# Patient Record
Sex: Female | Born: 1985 | Race: White | Hispanic: No | Marital: Married | Smoking: Never smoker
Health system: Southern US, Community
[De-identification: ages and names within clinical notes are randomized; demographics above are authoritative.]

## PROBLEM LIST (undated history)

## (undated) ENCOUNTER — Inpatient Hospital Stay (HOSPITAL_COMMUNITY): Payer: Self-pay

## (undated) DIAGNOSIS — I471 Supraventricular tachycardia, unspecified: Secondary | ICD-10-CM

## (undated) DIAGNOSIS — F32A Depression, unspecified: Secondary | ICD-10-CM

## (undated) DIAGNOSIS — F329 Major depressive disorder, single episode, unspecified: Secondary | ICD-10-CM

## (undated) DIAGNOSIS — F419 Anxiety disorder, unspecified: Secondary | ICD-10-CM

## (undated) DIAGNOSIS — R569 Unspecified convulsions: Secondary | ICD-10-CM

## (undated) DIAGNOSIS — D649 Anemia, unspecified: Secondary | ICD-10-CM

## (undated) DIAGNOSIS — E038 Other specified hypothyroidism: Secondary | ICD-10-CM

## (undated) DIAGNOSIS — E039 Hypothyroidism, unspecified: Secondary | ICD-10-CM

## (undated) HISTORY — PX: APPENDECTOMY: SHX54

## (undated) HISTORY — DX: Hypothyroidism, unspecified: E03.9

## (undated) HISTORY — DX: Other specified hypothyroidism: E03.8

## (undated) HISTORY — DX: Anxiety disorder, unspecified: F41.9

## (undated) HISTORY — DX: Supraventricular tachycardia, unspecified: I47.10

## (undated) HISTORY — DX: Supraventricular tachycardia: I47.1

## (undated) HISTORY — DX: Anemia, unspecified: D64.9

## (undated) HISTORY — DX: Unspecified convulsions: R56.9

---
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
## 2004-05-18 ENCOUNTER — Ambulatory Visit: Payer: Self-pay | Admitting: Family Medicine

## 2004-06-26 ENCOUNTER — Ambulatory Visit: Payer: Self-pay | Admitting: Family Medicine

## 2004-10-17 ENCOUNTER — Ambulatory Visit: Payer: Self-pay | Admitting: Family Medicine

## 2005-03-16 ENCOUNTER — Ambulatory Visit: Payer: Self-pay | Admitting: Family Medicine

## 2006-12-17 ENCOUNTER — Ambulatory Visit: Payer: Self-pay | Admitting: Family Medicine

## 2007-04-22 ENCOUNTER — Ambulatory Visit: Payer: Self-pay | Admitting: Family Medicine

## 2007-04-22 DIAGNOSIS — E038 Other specified hypothyroidism: Secondary | ICD-10-CM | POA: Insufficient documentation

## 2007-04-22 DIAGNOSIS — E039 Hypothyroidism, unspecified: Secondary | ICD-10-CM

## 2007-04-23 LAB — CONVERTED CEMR LAB
BUN: 6 mg/dL (ref 6–23)
Basophils Absolute: 0 10*3/uL (ref 0.0–0.1)
Basophils Relative: 0.3 % (ref 0.0–1.0)
CO2: 25 meq/L (ref 19–32)
Calcium: 9.6 mg/dL (ref 8.4–10.5)
Chloride: 104 meq/L (ref 96–112)
Cholesterol: 140 mg/dL (ref 0–200)
Creatinine, Ser: 0.7 mg/dL (ref 0.4–1.2)
Eosinophils Absolute: 0 10*3/uL (ref 0.0–0.6)
Eosinophils Relative: 0.7 % (ref 0.0–5.0)
Free T4: 0.8 ng/dL (ref 0.6–1.6)
GFR calc Af Amer: 133 mL/min
GFR calc non Af Amer: 110 mL/min
Glucose, Bld: 91 mg/dL (ref 70–99)
HCT: 40.1 % (ref 36.0–46.0)
HDL: 78 mg/dL (ref >39.0)
Hemoglobin: 14.3 g/dL (ref 12.0–15.0)
LDL Cholesterol: 48 mg/dL (ref 0–99)
Lymphocytes Relative: 29.1 % (ref 12.0–46.0)
MCHC: 35.7 g/dL (ref 30.0–36.0)
MCV: 88.6 fL (ref 78.0–100.0)
Monocytes Absolute: 0.5 10*3/uL (ref 0.2–0.7)
Monocytes Relative: 7.3 % (ref 3.0–11.0)
Neutro Abs: 4.3 10*3/uL (ref 1.4–7.7)
Neutrophils Relative %: 62.6 % (ref 43.0–77.0)
Platelets: 148 10*3/uL — ABNORMAL LOW (ref 150–400)
Potassium: 4.4 meq/L (ref 3.5–5.1)
RBC: 4.52 M/uL (ref 3.87–5.11)
RDW: 11.8 % (ref 11.5–14.6)
Sodium: 137 meq/L (ref 135–145)
TSH: 1.44 microintl units/mL (ref 0.35–5.50)
Total CHOL/HDL Ratio: 1.8
Triglycerides: 72 mg/dL (ref 0–149)
VLDL: 14 mg/dL (ref 0–40)
WBC: 6.8 10*3/uL (ref 4.5–10.5)

## 2007-05-08 ENCOUNTER — Telehealth (INDEPENDENT_AMBULATORY_CARE_PROVIDER_SITE_OTHER): Payer: Self-pay | Admitting: *Deleted

## 2011-10-18 ENCOUNTER — Telehealth: Payer: Self-pay | Admitting: Internal Medicine

## 2011-10-18 NOTE — Telephone Encounter (Signed)
Mom called stated daughter is getting married Saturday and has sinus infection  Can you see her for this

## 2011-10-18 NOTE — Telephone Encounter (Signed)
Left message for patient's mother to return my call.

## 2011-10-19 NOTE — Telephone Encounter (Signed)
Left another message asking Patty to return my call, I am trying to find out what her symptoms are.

## 2013-10-27 LAB — OB RESULTS CONSOLE HEPATITIS B SURFACE ANTIGEN: Hepatitis B Surface Ag: NEGATIVE

## 2013-10-27 LAB — OB RESULTS CONSOLE RPR: RPR: NONREACTIVE

## 2013-10-27 LAB — OB RESULTS CONSOLE RUBELLA ANTIBODY, IGM: Rubella: IMMUNE

## 2013-10-27 LAB — OB RESULTS CONSOLE GC/CHLAMYDIA
Chlamydia: NEGATIVE
GC PROBE AMP, GENITAL: NEGATIVE

## 2013-10-27 LAB — OB RESULTS CONSOLE ABO/RH: RH Type: POSITIVE

## 2013-10-27 LAB — OB RESULTS CONSOLE HIV ANTIBODY (ROUTINE TESTING): HIV: NONREACTIVE

## 2013-10-27 LAB — OB RESULTS CONSOLE ANTIBODY SCREEN: ANTIBODY SCREEN: NEGATIVE

## 2014-05-06 LAB — OB RESULTS CONSOLE GBS: GBS: NEGATIVE

## 2014-05-21 NOTE — L&D Delivery Note (Signed)
Delivery Note At 3:01 AM a healthy female was delivered via Vaginal, Spontaneous Delivery (Presentation: ant occiput).  APGAR: 9, 9 ; weight 7 lb 13.6 oz (3560 g).   Placenta status: Intact, Spontaneous.  Cord: 3 vessels with the following complications: None.  Cord pH: N/A  Anesthesia: Epidural  Episiotomy: None Lacerations:  Deep 2nd degree Suture Repair: 2.0 3.0 vicryl Est. Blood Loss (mL): 350  Mom to postpartum.  Baby to Couplet care / Skin to Skin.  Eppie Barhorst,MARIE-LYNE 06/10/2014, 3:38 AM

## 2014-05-23 ENCOUNTER — Inpatient Hospital Stay (HOSPITAL_COMMUNITY)
Admission: AD | Admit: 2014-05-23 | Discharge: 2014-05-23 | Disposition: A | Discharge status: Home / Self Care | Payer: BC Managed Care – PPO | Source: Ambulatory Visit | Attending: Obstetrics & Gynecology | Admitting: Obstetrics & Gynecology

## 2014-05-23 ENCOUNTER — Encounter (HOSPITAL_COMMUNITY): Payer: Self-pay | Admitting: *Deleted

## 2014-05-23 DIAGNOSIS — Z3A38 38 weeks gestation of pregnancy: Secondary | ICD-10-CM | POA: Diagnosis not present

## 2014-05-23 DIAGNOSIS — O368130 Decreased fetal movements, third trimester, not applicable or unspecified: Secondary | ICD-10-CM | POA: Insufficient documentation

## 2014-05-23 HISTORY — DX: Major depressive disorder, single episode, unspecified: F32.9

## 2014-05-23 HISTORY — DX: Depression, unspecified: F32.A

## 2014-05-23 NOTE — Discharge Instructions (Signed)
Braxton Hicks Contractions °Contractions of the uterus can occur throughout pregnancy. Contractions are not always a sign that you are in labor.  °WHAT ARE BRAXTON HICKS CONTRACTIONS?  °Contractions that occur before labor are called Braxton Hicks contractions, or false labor. Toward the end of pregnancy (32-34 weeks), these contractions can develop more often and may become more forceful. This is not true labor because these contractions do not result in opening (dilatation) and thinning of the cervix. They are sometimes difficult to tell apart from true labor because these contractions can be forceful and people have different pain tolerances. You should not feel embarrassed if you go to the hospital with false labor. Sometimes, the only way to tell if you are in true labor is for your health care provider to look for changes in the cervix. °If there are no prenatal problems or other health problems associated with the pregnancy, it is completely safe to be sent home with false labor and await the onset of true labor. °HOW CAN YOU TELL THE DIFFERENCE BETWEEN TRUE AND FALSE LABOR? °False Labor °· The contractions of false labor are usually shorter and not as hard as those of true labor.   °· The contractions are usually irregular.   °· The contractions are often felt in the front of the lower abdomen and in the groin.   °· The contractions may go away when you walk around or change positions while lying down.   °· The contractions get weaker and are shorter lasting as time goes on.   °· The contractions do not usually become progressively stronger, regular, and closer together as with true labor.   °True Labor °· Contractions in true labor last 30-70 seconds, become very regular, usually become more intense, and increase in frequency.   °· The contractions do not go away with walking.   °· The discomfort is usually felt in the top of the uterus and spreads to the lower abdomen and low back.   °· True labor can be  determined by your health care provider with an exam. This will show that the cervix is dilating and getting thinner.   °WHAT TO REMEMBER °· Keep up with your usual exercises and follow other instructions given by your health care provider.   °· Take medicines as directed by your health care provider.   °· Keep your regular prenatal appointments.   °· Eat and drink lightly if you think you are going into labor.   °· If Braxton Hicks contractions are making you uncomfortable:   °¨ Change your position from lying down or resting to walking, or from walking to resting.   °¨ Sit and rest in a tub of warm water.   °¨ Drink 2-3 glasses of water. Dehydration may cause these contractions.   °¨ Do slow and deep breathing several times an hour.   °WHEN SHOULD I SEEK IMMEDIATE MEDICAL CARE? °Seek immediate medical care if: °· Your contractions become stronger, more regular, and closer together.   °· You have fluid leaking or gushing from your vagina.   °· You have a fever.   °· You pass blood-tinged mucus.   °· You have vaginal bleeding.   °· You have continuous abdominal pain.   °· You have low back pain that you never had before.   °· You feel your baby's head pushing down and causing pelvic pressure.   °· Your baby is not moving as much as it used to.   °Document Released: 05/07/2005 Document Revised: 05/12/2013 Document Reviewed: 02/16/2013 °ExitCare® Patient Information ©2015 [employer]. This information is not intended to replace advice given to you by your health care   provider. Make sure you discuss any questions you have with your health care provider. °Fetal Movement Counts °Patient Name: __________________________________________________ Patient Due Date: ____________________ °Performing a fetal movement count is highly recommended in high-risk pregnancies, but it is good for every pregnant woman to do. Your health care provider may ask you to start counting fetal movements at 28 weeks of the pregnancy. Fetal  movements often increase: °· After eating a full meal. °· After physical activity. °· After eating or drinking something sweet or cold. °· At rest. °Pay attention to when you feel the baby is most active. This will help you notice a pattern of your baby's sleep and wake cycles and what factors contribute to an increase in fetal movement. It is important to perform a fetal movement count at the same time each day when your baby is normally most active.  °HOW TO COUNT FETAL MOVEMENTS °· Find a quiet and comfortable area to sit or lie down on your left side. Lying on your left side provides the best blood and oxygen circulation to your baby. °· Write down the day and time on a sheet of paper or in a journal. °· Start counting kicks, flutters, swishes, rolls, or jabs in a 2-hour period. You should feel at least 10 movements within 2 hours. °· If you do not feel 10 movements in 2 hours, wait 2-3 hours and count again. Look for a change in the pattern or not enough counts in 2 hours. °SEEK MEDICAL CARE IF: °· You feel less than 10 counts in 2 hours, tried twice. °· There is no movement in over an hour. °· The pattern is changing or taking longer each day to reach 10 counts in 2 hours. °· You feel the baby is not moving as he or she usually does. °Date: ____________ Movements: ____________ Start time: ____________ Finish time: ____________  °Date: ____________ Movements: ____________ Start time: ____________ Finish time: ____________ °Date: ____________ Movements: ____________ Start time: ____________ Finish time: ____________ °Date: ____________ Movements: ____________ Start time: ____________ Finish time: ____________ °Date: ____________ Movements: ____________ Start time: ____________ Finish time: ____________ °Date: ____________ Movements: ____________ Start time: ____________ Finish time: ____________ °Date: ____________ Movements: ____________ Start time: ____________ Finish time: ____________ °Date: ____________  Movements: ____________ Start time: ____________ Finish time: ____________  °Date: ____________ Movements: ____________ Start time: ____________ Finish time: ____________ °Date: ____________ Movements: ____________ Start time: ____________ Finish time: ____________ °Date: ____________ Movements: ____________ Start time: ____________ Finish time: ____________ °Date: ____________ Movements: ____________ Start time: ____________ Finish time: ____________ °Date: ____________ Movements: ____________ Start time: ____________ Finish time: ____________ °Date: ____________ Movements: ____________ Start time: ____________ Finish time: ____________ °Date: ____________ Movements: ____________ Start time: ____________ Finish time: ____________  °Date: ____________ Movements: ____________ Start time: ____________ Finish time: ____________ °Date: ____________ Movements: ____________ Start time: ____________ Finish time: ____________ °Date: ____________ Movements: ____________ Start time: ____________ Finish time: ____________ °Date: ____________ Movements: ____________ Start time: ____________ Finish time: ____________ °Date: ____________ Movements: ____________ Start time: ____________ Finish time: ____________ °Date: ____________ Movements: ____________ Start time: ____________ Finish time: ____________ °Date: ____________ Movements: ____________ Start time: ____________ Finish time: ____________  °Date: ____________ Movements: ____________ Start time: ____________ Finish time: ____________ °Date: ____________ Movements: ____________ Start time: ____________ Finish time: ____________ °Date: ____________ Movements: ____________ Start time: ____________ Finish time: ____________ °Date: ____________ Movements: ____________ Start time: ____________ Finish time: ____________ °Date: ____________ Movements: ____________ Start time: ____________ Finish time: ____________ °Date: ____________ Movements: ____________ Start time:  ____________ Finish time: ____________ °Date: ____________ Movements: ____________   Start time: ____________ Finish time: ____________  °Date: ____________ Movements: ____________ Start time: ____________ Finish time: ____________ °Date: ____________ Movements: ____________ Start time: ____________ Finish time: ____________ °Date: ____________ Movements: ____________ Start time: ____________ Finish time: ____________ °Date: ____________ Movements: ____________ Start time: ____________ Finish time: ____________ °Date: ____________ Movements: ____________ Start time: ____________ Finish time: ____________ °Date: ____________ Movements: ____________ Start time: ____________ Finish time: ____________ °Date: ____________ Movements: ____________ Start time: ____________ Finish time: ____________  °Date: ____________ Movements: ____________ Start time: ____________ Finish time: ____________ °Date: ____________ Movements: ____________ Start time: ____________ Finish time: ____________ °Date: ____________ Movements: ____________ Start time: ____________ Finish time: ____________ °Date: ____________ Movements: ____________ Start time: ____________ Finish time: ____________ °Date: ____________ Movements: ____________ Start time: ____________ Finish time: ____________ °Date: ____________ Movements: ____________ Start time: ____________ Finish time: ____________ °Date: ____________ Movements: ____________ Start time: ____________ Finish time: ____________  °Date: ____________ Movements: ____________ Start time: ____________ Finish time: ____________ °Date: ____________ Movements: ____________ Start time: ____________ Finish time: ____________ °Date: ____________ Movements: ____________ Start time: ____________ Finish time: ____________ °Date: ____________ Movements: ____________ Start time: ____________ Finish time: ____________ °Date: ____________ Movements: ____________ Start time: ____________ Finish time: ____________ °Date:  ____________ Movements: ____________ Start time: ____________ Finish time: ____________ °Date: ____________ Movements: ____________ Start time: ____________ Finish time: ____________  °Date: ____________ Movements: ____________ Start time: ____________ Finish time: ____________ °Date: ____________ Movements: ____________ Start time: ____________ Finish time: ____________ °Date: ____________ Movements: ____________ Start time: ____________ Finish time: ____________ °Date: ____________ Movements: ____________ Start time: ____________ Finish time: ____________ °Date: ____________ Movements: ____________ Start time: ____________ Finish time: ____________ °Date: ____________ Movements: ____________ Start time: ____________ Finish time: ____________ °Document Released: 06/06/2006 Document Revised: 09/21/2013 Document Reviewed: 03/03/2012 °[employer]® Patient Information ©2015 [employer]. This information is not intended to replace advice given to you by your health care provider. Make sure you discuss any questions you have with your health care provider. ° °

## 2014-05-23 NOTE — Progress Notes (Signed)
TC from nurse - patient in for decreased FM and questionable labor  NST reviewed - reactive from 115 baseline / no decels / moderate variability No evidence of labor  Discharged via phone order with labor precautions and FKC sheet. Apt in our office this week as schedule  Marlinda Mike CNM [facility]

## 2014-05-23 NOTE — MAU Note (Signed)
Has not felt baby move like normal since yesterday but things she has felt some small movements in two hours.  Denies LOF.VB.  Has had some contractions

## 2014-06-07 ENCOUNTER — Telehealth (HOSPITAL_COMMUNITY): Payer: Self-pay | Admitting: *Deleted

## 2014-06-07 NOTE — Telephone Encounter (Signed)
Preadmission screen  

## 2014-06-09 ENCOUNTER — Inpatient Hospital Stay (HOSPITAL_COMMUNITY): Payer: BLUE CROSS/BLUE SHIELD | Admitting: Anesthesiology

## 2014-06-09 ENCOUNTER — Inpatient Hospital Stay (HOSPITAL_COMMUNITY)
Admission: RE | Admit: 2014-06-09 | Discharge: 2014-06-12 | DRG: 775 | Disposition: A | Discharge status: Home / Self Care | Payer: BLUE CROSS/BLUE SHIELD | Source: Ambulatory Visit | Attending: Obstetrics & Gynecology | Admitting: Obstetrics & Gynecology

## 2014-06-09 ENCOUNTER — Encounter (HOSPITAL_COMMUNITY): Payer: Self-pay

## 2014-06-09 DIAGNOSIS — Z3A40 40 weeks gestation of pregnancy: Secondary | ICD-10-CM | POA: Diagnosis present

## 2014-06-09 DIAGNOSIS — Z881 Allergy status to other antibiotic agents status: Secondary | ICD-10-CM | POA: Diagnosis not present

## 2014-06-09 DIAGNOSIS — Z880 Allergy status to penicillin: Secondary | ICD-10-CM

## 2014-06-09 DIAGNOSIS — Z885 Allergy status to narcotic agent status: Secondary | ICD-10-CM | POA: Diagnosis not present

## 2014-06-09 DIAGNOSIS — O480 Post-term pregnancy: Secondary | ICD-10-CM | POA: Diagnosis present

## 2014-06-09 LAB — CBC
HCT: 38.3 % (ref 36.0–46.0)
Hemoglobin: 13.3 g/dL (ref 12.0–15.0)
MCH: 31.2 pg (ref 26.0–34.0)
MCHC: 34.7 g/dL (ref 30.0–36.0)
MCV: 89.9 fL (ref 78.0–100.0)
PLATELETS: 201 10*3/uL (ref 150–400)
RBC: 4.26 MIL/uL (ref 3.87–5.11)
RDW: 13 % (ref 11.5–15.5)
WBC: 10.9 10*3/uL — ABNORMAL HIGH (ref 4.0–10.5)

## 2014-06-09 LAB — TYPE AND SCREEN
ABO/RH(D): O POS
Antibody Screen: NEGATIVE

## 2014-06-09 LAB — ABO/RH: ABO/RH(D): O POS

## 2014-06-09 MED ORDER — EPHEDRINE 5 MG/ML INJ
10.0000 mg | INTRAVENOUS | Status: DC | PRN
  Filled 2014-06-09: qty 2

## 2014-06-09 MED ORDER — LIDOCAINE HCL (PF) 1 % IJ SOLN
INTRAMUSCULAR | Status: DC | PRN
  Administered 2014-06-09 (×2): 8 mL

## 2014-06-09 MED ORDER — PHENYLEPHRINE 40 MCG/ML (10ML) SYRINGE FOR IV PUSH (FOR BLOOD PRESSURE SUPPORT)
80.0000 ug | PREFILLED_SYRINGE | INTRAVENOUS | Status: DC | PRN
  Filled 2014-06-09: qty 2

## 2014-06-09 MED ORDER — OXYTOCIN 40 UNITS IN LACTATED RINGERS INFUSION - SIMPLE MED
62.5000 mL/h | INTRAVENOUS | Status: DC
  Administered 2014-06-10: 62.5 mL/h via INTRAVENOUS

## 2014-06-09 MED ORDER — OXYTOCIN 40 UNITS IN LACTATED RINGERS INFUSION - SIMPLE MED
1.0000 m[IU]/min | INTRAVENOUS | Status: DC
  Administered 2014-06-09: 2 m[IU]/min via INTRAVENOUS
  Filled 2014-06-09: qty 1000

## 2014-06-09 MED ORDER — ACETAMINOPHEN 325 MG PO TABS
650.0000 mg | ORAL_TABLET | ORAL | Status: DC | PRN
Start: 2014-06-09 — End: 2014-06-10

## 2014-06-09 MED ORDER — ONDANSETRON HCL 4 MG/2ML IJ SOLN: 4.0000 mg | Freq: Four times a day (QID) | INTRAMUSCULAR | Status: DC | PRN

## 2014-06-09 MED ORDER — DIPHENHYDRAMINE HCL 50 MG/ML IJ SOLN: 12.5000 mg | INTRAMUSCULAR | Status: DC | PRN

## 2014-06-09 MED ORDER — TERBUTALINE SULFATE 1 MG/ML IJ SOLN: 0.2500 mg | Freq: Once | INTRAMUSCULAR | Status: AC | PRN

## 2014-06-09 MED ORDER — LACTATED RINGERS IV SOLN
500.0000 mL | INTRAVENOUS | Status: DC | PRN
  Administered 2014-06-09: 500 mL via INTRAVENOUS

## 2014-06-09 MED ORDER — LIDOCAINE HCL (PF) 1 % IJ SOLN
30.0000 mL | INTRAMUSCULAR | Status: DC | PRN
  Filled 2014-06-09: qty 30

## 2014-06-09 MED ORDER — FENTANYL 2.5 MCG/ML BUPIVACAINE 1/10 % EPIDURAL INFUSION (WH - ANES)
INTRAMUSCULAR | Status: DC | PRN
  Administered 2014-06-09: 14 mL/h via EPIDURAL

## 2014-06-09 MED ORDER — FLEET ENEMA 7-19 GM/118ML RE ENEM: 1.0000 | ENEMA | Freq: Every day | RECTAL | Status: DC | PRN

## 2014-06-09 MED ORDER — CITRIC ACID-SODIUM CITRATE 334-500 MG/5ML PO SOLN: 30.0000 mL | ORAL | Status: DC | PRN

## 2014-06-09 MED ORDER — OXYCODONE-ACETAMINOPHEN 5-325 MG PO TABS
1.0000 | ORAL_TABLET | ORAL | Status: DC | PRN
  Administered 2014-06-10: 1 via ORAL
  Filled 2014-06-09: qty 1

## 2014-06-09 MED ORDER — OXYTOCIN BOLUS FROM INFUSION: 500.0000 mL | INTRAVENOUS | Status: DC

## 2014-06-09 MED ORDER — OXYCODONE-ACETAMINOPHEN 5-325 MG PO TABS: 2.0000 | ORAL_TABLET | ORAL | Status: DC | PRN

## 2014-06-09 MED ORDER — PHENYLEPHRINE 40 MCG/ML (10ML) SYRINGE FOR IV PUSH (FOR BLOOD PRESSURE SUPPORT)
80.0000 ug | PREFILLED_SYRINGE | INTRAVENOUS | Status: DC | PRN
  Filled 2014-06-09: qty 2
  Filled 2014-06-09: qty 20

## 2014-06-09 MED ORDER — LACTATED RINGERS IV SOLN
500.0000 mL | Freq: Once | INTRAVENOUS | Status: AC
  Administered 2014-06-09: 500 mL via INTRAVENOUS

## 2014-06-09 MED ORDER — LACTATED RINGERS IV SOLN
INTRAVENOUS | Status: DC
  Administered 2014-06-09 (×2): via INTRAVENOUS

## 2014-06-09 MED ORDER — FENTANYL 2.5 MCG/ML BUPIVACAINE 1/10 % EPIDURAL INFUSION (WH - ANES)
14.0000 mL/h | INTRAMUSCULAR | Status: DC | PRN
  Administered 2014-06-09 (×2): 14 mL/h via EPIDURAL
  Filled 2014-06-09 (×2): qty 125

## 2014-06-09 NOTE — Progress Notes (Signed)
Subjective: Doing well, pain controled, UCs q2-3 min  Anesthesia epidural   Objective: BP 116/67 mmHg  Pulse 76  Temp(Src) 97.6 F (36.4 C) (Oral)  Resp 18  Ht 5\' 7"  (1.702 m)  Wt 180 lb (81.647 kg)  BMI 28.19 kg/m2  SpO2 100%  LMP  (LMP Unknown)   FHT:  FHR: 130's bpm, variability: moderate,  accelerations:  Present,  decelerations:  Absent UC:   regular, every 2-3 minutes VE:   Dilation: 4.5 Effacement (%): 100 Station: 0 Exam by:: L Lamon RN   Assessment / Plan: Induction of labor due to postterm,  progressing well on pitocin  Fetal Wellbeing:  Category I Pain Control:  Epidural  Anticipated MOD:  NSVD  Megan Fernandez,MARIE-LYNE 06/09/2014, 5:26 PM

## 2014-06-09 NOTE — Anesthesia Procedure Notes (Signed)
Epidural Patient location during procedure: OB Start time: 06/09/2014 4:02 PM End time: 06/09/2014 4:06 PM  Staffing Anesthesiologist: Leilani AbleHATCHETT, Dosia Yodice Performed by: anesthesiologist   Preanesthetic Checklist Completed: patient identified, surgical consent, pre-op evaluation, timeout performed, IV checked, risks and benefits discussed and monitors and equipment checked  Epidural Patient position: sitting Prep: site prepped and draped and DuraPrep Patient monitoring: continuous pulse ox and blood pressure Approach: midline Location: L3-L4 Injection technique: LOR air  Needle:  Needle type: Tuohy  Needle gauge: 17 G Needle length: 9 cm and 9 Needle insertion depth: 5 cm cm Catheter type: closed end flexible Catheter size: 19 Gauge Catheter at skin depth: 10 cm Test dose: negative and Other  Assessment Sensory level: T9 Events: blood not aspirated, injection not painful, no injection resistance, negative IV test and no paresthesia

## 2014-06-09 NOTE — H&P (Signed)
Megan Fernandez is a 29 y.o. female G1P0 [id number] presenting for Induction post dates.  OB History    Gravida Para Term Preterm AB TAB SAB Ectopic Multiple Living   1              Past Medical History  Diagnosis Date  . Depression    Past Surgical History  Procedure Laterality Date  . Appendectomy     Family History: family history is not on file. Social History:  reports that she has never smoked. She does not have any smokeless tobacco history on file. She reports that she drinks alcohol. She reports that she does not use illicit drugs.  Allergies  Allergen Reactions  . Azithromycin Other (See Comments)    Reaction:  Abdominal cramps   . Codeine Nausea And Vomiting  . Doxycycline Hives  . Morphine And Related   . Penicillins Swelling and Other (See Comments)    Reaction:  Tongue swelled as a child      Blood pressure 117/72, pulse 80, temperature 98 F (36.7 C), temperature source Oral, resp. rate 16, height 5\' 7"  (1.702 m), weight 180 lb (81.647 kg). Exam Physical Exam   VE 2+/70%/Vtx/-2 AROM clear AF ++  FHR Bline 130's, good variability, accelerations present, no deceleration.  HPP:  Patient Active Problem List   Diagnosis Date Noted  . HYPOTHYROIDISM 04/22/2007    Prenatal labs: ABO, Rh: O/Positive/-- (06/09 0000) Antibody: Negative (06/09 0000) Rubella:  Immune RPR: Nonreactive (06/09 0000)  HBsAg: Negative (06/09 0000)  HIV: Non-reactive (06/09 0000)  Genetic testing: Sequential testing neg US anato: wnl 1 hr GTT: wnl GBS: Negative (12/17 0000)   Assessment/Plan: 40 6/7 wks induction for postdates.  Favorable cervix/Fetal well-being reassuring.  AROM/Pitocin.  Will probably have an epidural in active labor.    Alper Guilmette,MARIE-LYNE 06/09/2014, 8:29 AM

## 2014-06-09 NOTE — Progress Notes (Signed)
Dr. Arby BarretteHatchett spoke with patient and FOB about risks and benefits of epidural.

## 2014-06-09 NOTE — Anesthesia Preprocedure Evaluation (Signed)
Anesthesia Evaluation  Patient identified by MRN, date of birth, ID band Patient awake    Reviewed: Allergy & Precautions, H&P , NPO status , Patient's Chart, lab work & pertinent test results  Airway Mallampati: I  TM Distance: >3 FB Neck ROM: full    Dental no notable dental hx.    Pulmonary neg pulmonary ROS,    Pulmonary exam normal       Cardiovascular negative cardio ROS      Neuro/Psych negative neurological ROS     GI/Hepatic negative GI ROS, Neg liver ROS,   Endo/Other  negative endocrine ROS  Renal/GU negative Renal ROS     Musculoskeletal   Abdominal Normal abdominal exam  (+)   Peds  Hematology negative hematology ROS (+)   Anesthesia Other Findings   Reproductive/Obstetrics (+) Pregnancy                             Anesthesia Physical Anesthesia Plan  ASA: II  Anesthesia Plan: Epidural   Post-op Pain Management:    Induction:   Airway Management Planned:   Additional Equipment:   Intra-op Plan:   Post-operative Plan:   Informed Consent: I have reviewed the patients History and Physical, chart, labs and discussed the procedure including the risks, benefits and alternatives for the proposed anesthesia with the patient or authorized representative who has indicated his/her understanding and acceptance.     Plan Discussed with:   Anesthesia Plan Comments:         Anesthesia Quick Evaluation  

## 2014-06-10 ENCOUNTER — Encounter (HOSPITAL_COMMUNITY): Payer: Self-pay

## 2014-06-10 LAB — CBC
HCT: 35.3 % — ABNORMAL LOW (ref 36.0–46.0)
Hemoglobin: 12.1 g/dL (ref 12.0–15.0)
MCH: 30.7 pg (ref 26.0–34.0)
MCHC: 34.3 g/dL (ref 30.0–36.0)
MCV: 89.6 fL (ref 78.0–100.0)
Platelets: 174 10*3/uL (ref 150–400)
RBC: 3.94 MIL/uL (ref 3.87–5.11)
RDW: 12.9 % (ref 11.5–15.5)
WBC: 26.6 10*3/uL — ABNORMAL HIGH (ref 4.0–10.5)

## 2014-06-10 LAB — RPR: RPR: NONREACTIVE

## 2014-06-10 MED ORDER — LANOLIN HYDROUS EX OINT: TOPICAL_OINTMENT | CUTANEOUS | Status: DC | PRN

## 2014-06-10 MED ORDER — ZOLPIDEM TARTRATE 5 MG PO TABS: 5.0000 mg | ORAL_TABLET | Freq: Every evening | ORAL | Status: DC | PRN

## 2014-06-10 MED ORDER — DIPHENHYDRAMINE HCL 25 MG PO CAPS: 25.0000 mg | ORAL_CAPSULE | Freq: Four times a day (QID) | ORAL | Status: DC | PRN

## 2014-06-10 MED ORDER — SENNOSIDES-DOCUSATE SODIUM 8.6-50 MG PO TABS
2.0000 | ORAL_TABLET | ORAL | Status: DC
  Administered 2014-06-10 – 2014-06-12 (×3): 2 via ORAL
  Filled 2014-06-10 (×3): qty 2

## 2014-06-10 MED ORDER — BENZOCAINE-MENTHOL 20-0.5 % EX AERO
1.0000 "application " | INHALATION_SPRAY | CUTANEOUS | Status: DC | PRN
  Administered 2014-06-10: 1 via TOPICAL
  Filled 2014-06-10: qty 56

## 2014-06-10 MED ORDER — OXYCODONE-ACETAMINOPHEN 5-325 MG PO TABS: 2.0000 | ORAL_TABLET | ORAL | Status: DC | PRN

## 2014-06-10 MED ORDER — PRENATAL MULTIVITAMIN CH
1.0000 | ORAL_TABLET | Freq: Every day | ORAL | Status: DC
  Administered 2014-06-10 – 2014-06-12 (×3): 1 via ORAL
  Filled 2014-06-10 (×3): qty 1

## 2014-06-10 MED ORDER — IBUPROFEN 600 MG PO TABS
600.0000 mg | ORAL_TABLET | Freq: Four times a day (QID) | ORAL | Status: DC
  Administered 2014-06-10 – 2014-06-12 (×10): 600 mg via ORAL
  Filled 2014-06-10 (×11): qty 1

## 2014-06-10 MED ORDER — ONDANSETRON HCL 4 MG/2ML IJ SOLN: 4.0000 mg | INTRAMUSCULAR | Status: DC | PRN

## 2014-06-10 MED ORDER — TETANUS-DIPHTH-ACELL PERTUSSIS 5-2.5-18.5 LF-MCG/0.5 IM SUSP: 0.5000 mL | Freq: Once | INTRAMUSCULAR | Status: DC

## 2014-06-10 MED ORDER — OXYCODONE-ACETAMINOPHEN 5-325 MG PO TABS
1.0000 | ORAL_TABLET | ORAL | Status: DC | PRN
  Administered 2014-06-10 – 2014-06-12 (×8): 1 via ORAL
  Filled 2014-06-10 (×8): qty 1

## 2014-06-10 MED ORDER — ONDANSETRON HCL 4 MG PO TABS: 4.0000 mg | ORAL_TABLET | ORAL | Status: DC | PRN

## 2014-06-10 MED ORDER — WITCH HAZEL-GLYCERIN EX PADS: 1.0000 "application " | MEDICATED_PAD | CUTANEOUS | Status: DC | PRN

## 2014-06-10 MED ORDER — DIBUCAINE 1 % RE OINT: 1.0000 "application " | TOPICAL_OINTMENT | RECTAL | Status: DC | PRN

## 2014-06-10 MED ORDER — SIMETHICONE 80 MG PO CHEW: 80.0000 mg | CHEWABLE_TABLET | ORAL | Status: DC | PRN

## 2014-06-10 NOTE — Anesthesia Postprocedure Evaluation (Signed)
  Anesthesia Post-op Note  Patient: Megan Fernandez  Procedure(s) Performed: * No procedures listed *  Patient Location: Mother/Baby  Anesthesia Type:Epidural  Level of Consciousness: awake, alert , oriented and patient cooperative  Airway and Oxygen Therapy: Patient Spontanous Breathing  Post-op Pain: mild  Post-op Assessment: Post-op Vital signs reviewed, Patient's Cardiovascular Status Stable, Respiratory Function Stable, Patent Airway, No headache, No backache, No residual numbness and No residual motor weakness  Post-op Vital Signs: Reviewed and stable  Last Vitals:  Filed Vitals:   06/10/14 0656  BP: 109/54  Pulse: 78  Temp: 36.8 C  Resp: 18    Complications: No apparent anesthesia complications

## 2014-06-10 NOTE — Lactation Note (Signed)
This note was copied from the chart of Girl Patria Manemily Berrie. Lactation Consultation Note  Patient Name: Girl Patria Manemily Wimer ZOXWR'UToday's Date: 06/10/2014 Reason for consult: Follow-up assessment;Difficult latch.  RN, Clydie BraunKaren assisted this mom with recent feeding and mom says baby finally able to latch well with nurse's help.  FOB present and both parents have basic breastfeeding questions.  Baby about to be placed under phototx so LC encouraged a minimum of q3h feedings and/or expression and syringe-feeding of colostrum to help baby pass stools and lower her bilirubin.  Baby has had several voids and 1 stool thus far and is 17 hours of age.  Mom holding baby STS and no cuing noted at this time.   Maternal Data    Feeding Feeding Type: Breast Fed Length of feed: 15 min (multiple attempts)  LATCH Score/Interventions Latch: Repeated attempts needed to sustain latch, nipple held in mouth throughout feeding, stimulation needed to elicit sucking reflex. Intervention(s): Skin to skin;Teach feeding cues;Waking techniques (only maintains a latch for 1-2 minutes)  Audible Swallowing: A few with stimulation Intervention(s): Skin to skin;Hand expression Intervention(s): Hand expression;Alternate breast massage  Type of Nipple: Everted at rest and after stimulation  Comfort (Breast/Nipple): Soft / non-tender     Hold (Positioning): Assistance needed to correctly position infant at breast and maintain latch.  LATCH Score: 7 (recent feeding, per RN assessment)  Lactation Tools Discussed/Used    STS, cue feedings but q3h if baby sleepy due to jaundice, hand express if needed for supplement  Consult Status Consult Status: Follow-up Date: 06/11/14 Follow-up type: In-patient    Warrick ParisianBryant, Ramell Wacha [facility] 06/10/2014, 8:24 PM

## 2014-06-10 NOTE — Lactation Note (Signed)
This note was copied from the chart of Megan Fernandez. Lactation Consultation Note New mom w/everted nipples, has had changes in breast, leaking for a months or so. Noted Rt. Nipple leaking bloody discharge. Breast slightly heavy. Attempted to latch baby. Baby fussy, spitty, wouldn't suck on breast, although tried, and rooting some. Suction bulb syring taught. Suctioned clear mucous several times. Hand expression taught. Lt. Breast expresses clear yellow colostrum, Rt. Breast expresses clear yellow colostrum w/streak of rusty pipe syndrom. Denies pain. Expressed approx. 7ml. Attempted to give to baby w/gloved finger but wouldn't suck.  Mom encouraged to feed baby 8-12 times/24 hours and with feeding cues. Mom encouraged to waken baby for feeds.  Educated about newborn behavior. Referred to Baby and Me Book in Breastfeeding section Pg. 22-23 for position options and Proper latch demonstration.Encouraged comfort during BF so colostrum flows better and mom will enjoy the feeding longer. Taking deep breaths and breast massage during BF. Mom encouraged to do skin-to-skin. Mom shells and encouraged to wear them between feedings to assist nipples to evert more for a deep latch. WH/LC brochure given w/resources, support groups and LC services. Patient Name: Megan Megan Fernandez [id number]'s Date: 06/10/2014 Reason for consult: Initial assessment   Maternal Data Has patient been taught Hand Expression?: Yes Does the patient have breastfeeding experience prior to this delivery?: No  Feeding Feeding Type: Breast Fed Length of feed: 0 min  LATCH Score/Interventions Latch: Too sleepy or reluctant, no latch achieved, no sucking elicited. Intervention(s): Skin to skin;Teach feeding cues;Waking techniques  Audible Swallowing: None Intervention(s): Hand expression;Skin to skin  Type of Nipple: Everted at rest and after stimulation  Comfort (Breast/Nipple): Soft / non-tender     Hold (Positioning): Full  assist, staff holds infant at breast Intervention(s): Breastfeeding basics reviewed;Support Pillows;Position options;Skin to skin  LATCH Score: 4  Lactation Tools Discussed/Used Tools: Shells;Pump Shell Type: Inverted Breast pump type: Manual Pump Review: Setup, frequency, and cleaning;Milk Storage Initiated by:: Peri JeffersonL. Zamiya Dillard RN Date initiated:: 06/10/14   Consult Status Consult Status: Follow-up Date: 06/11/14 Follow-up type: In-patient    Charyl DancerCARVER, Hernando Reali G 06/10/2014, 6:57 AM

## 2014-06-10 NOTE — Progress Notes (Addendum)
Patient ID: Megan Fernandez, female   DOB: [birth year], 29 y.o.   MRN: [id number] PPD # 0 SVD  S:  Reports feeling well             Tolerating po/ No nausea or vomiting             Bleeding is light             Pain controlled with ibuprofen (OTC)             Up ad lib / ambulatory / voiding without difficulties    Newborn  Information for the patient's newborn:  Mickel Crowalmieri, Girl Andrey [[id number]][[id number]]  female  breast feeding     O:  A & O x 3, in no apparent distress              VS:  Filed Vitals:   06/10/14 0430 06/10/14 0446 06/10/14 0537 06/10/14 0656  BP: 110/62 126/72 112/60 109/54  Pulse: 84 87 82 78  Temp:   98.2 F (36.8 C) 98.3 F (36.8 C)  TempSrc:      Resp:   18 18  Height:      Weight:      SpO2:   98% 97%    LABS:  Recent Labs  06/09/14 0815 06/10/14 0653  WBC 10.9* 26.6*  HGB 13.3 12.1  HCT 38.3 35.3*  PLT 201 174    Blood type: O POS (01/20 0815)  Rubella: Immune (06/09 0000)   I&O: I/O last 3 completed shifts: In: -  Out: 350 [Blood:350]             Lungs: Clear and unlabored  Heart: regular rate and rhythm / no murmurs  Abdomen: soft, non-tender, non-distended              Fundus: firm, non-tender, U-1  Perineum: 2nd degree repair healing well - ice pack in place  Lochia: minimal  Extremities: trace edema, no calf pain or tenderness, no Homans    A/P: PPD # 0  28 y.o., G1P1001   Principal Problem:    Postpartum care following vaginal delivery (1/21)  Active Problems:    Postpartum state   Doing well - stable status  Routine post partum orders  Anticipate discharge tomorrow    Raelyn MoraAWSON, Medard Decuir, M, MSN, CNM 06/10/2014, 10:17 AM

## 2014-06-11 NOTE — Progress Notes (Signed)
PPD #1- SVD  Subjective:   Reports feeling well, perineum sore Tolerating po/ No nausea or vomiting Bleeding is light, moderate Pain controlled with Motrin Up ad lib / ambulatory / voiding without problems Newborn: breastfeeding, nipples sore   Objective:   VS:  VS:  Filed Vitals:   06/10/14 0656 06/10/14 1115 06/10/14 1745 06/11/14 0620  BP: 109/54 115/70 112/48 103/49  Pulse: 78 87 71 75  Temp: 98.3 F (36.8 C) 97.7 F (36.5 C) 97.5 F (36.4 C) 97.5 F (36.4 C)  TempSrc:  Oral Oral Oral  Resp: 18 18 18 18   Height:      Weight:      SpO2: 97%   99%    LABS:  Recent Labs  06/09/14 0815 06/10/14 0653  WBC 10.9* 26.6*  HGB 13.3 12.1  PLT 201 174   Blood type: --/--/O POS, O POS (01/20 0815) Rubella: Immune (06/09 0000)   I&O: Intake/Output      01/21 0701 - 01/22 0700 01/22 0701 - 01/23 0700   Blood     Total Output       Net              Physical Exam: Alert and oriented x3 Abdomen: soft, non-tender, non-distended  Fundus: firm, non-tender, U-2 Perineum: Well approximated, no significant erythema, or drainage; moderate edema, ice pk in place; healing well. Lochia: samll Extremities: No edema, no calf pain or tenderness    Assessment:  PPD #1 G1P1001/ S/P:induced vaginal, 2nd degree laceration  Doing well    Plan: Lactation consult for assessment and recommendations Continue routine post partum orders Anticipate D/C home tomorrow   Donette LarryBHAMBRI, Ethie Curless, N MSN, CNM 06/11/2014, 9:28 AM

## 2014-06-11 NOTE — Lactation Note (Signed)
This note was copied from the chart of Megan Patria Manemily Ditton. Lactation Consultation Note; Mom reports that baby has not fed since 5 am. Assisted with latch, baby took a few attempts took off bili lights, then latched well, mom reports some pain with initial latch, adjusted bottom lip and mom reports that feels better. Encouraged to wake baby q 3 hours for feeding. No questions at present. To call prn  Patient Name: Megan Fernandez's Date: 06/11/2014 Reason for consult: Follow-up assessment   Maternal Data Formula Feeding for Exclusion: No  Feeding Feeding Type: Breast Fed Length of feed: 15 min  LATCH Score/Interventions Latch: Grasps breast easily, tongue down, lips flanged, rhythmical sucking.  Audible Swallowing: A few with stimulation  Type of Nipple: Everted at rest and after stimulation  Comfort (Breast/Nipple): Filling, red/small blisters or bruises, mild/mod discomfort  Problem noted: Mild/Moderate discomfort Interventions (Mild/moderate discomfort): Comfort gels  Hold (Positioning): Assistance needed to correctly position infant at breast and maintain latch. Intervention(s): Breastfeeding basics reviewed  LATCH Score: 7  Lactation Tools Discussed/Used     Consult Status Consult Status: Follow-up Date: 06/12/14 Follow-up type: In-patient    Pamelia HoitWeeks, Ubah Radke D 06/11/2014, 10:49 AM

## 2014-06-12 MED ORDER — OXYCODONE-ACETAMINOPHEN 5-325 MG PO TABS: 1.0000 | ORAL_TABLET | ORAL | Status: DC | PRN

## 2014-06-12 MED ORDER — IBUPROFEN 600 MG PO TABS: 600.0000 mg | ORAL_TABLET | Freq: Four times a day (QID) | ORAL | Status: DC

## 2014-06-12 NOTE — Discharge Summary (Signed)
Obstetric Discharge Summary  Reason for Admission: onset of labor Prenatal Procedures: none Intrapartum Procedures: spontaneous vaginal delivery Postpartum Procedures: none Complications-Operative and Postpartum: 2nd degree perineal laceration HEMOGLOBIN  Date Value Ref Range Status  06/10/2014 12.1 12.0 - 15.0 g/dL Final   HCT  Date Value Ref Range Status  06/10/2014 35.3* 36.0 - 46.0 % Final    Physical Exam:  General: alert, cooperative and no distress Lochia: appropriate Uterine Fundus: firm Incision: healing well DVT Evaluation: No evidence of DVT seen on physical exam.  Discharge Diagnoses: Term Pregnancy-delivered  Discharge Information: Date: 06/12/2014 Activity: pelvic rest Diet: routine Medications: PNV, Ibuprofen and Percocet Condition: stable Instructions: refer to practice specific booklet Discharge to: home Follow-up Information    Follow up with LAVOIE,MARIE-LYNE, MD. Schedule an appointment as soon as possible for a visit in 6 weeks.   Specialty:  Obstetrics and Gynecology   Contact information:   [address] [place] [address] [phone]       Newborn Data: Live born female  Birth Weight: 7 lb 13.6 oz (3561 g) APGAR: 9, 9  Home with mother.  Megan Fernandez, Megan Fernandez 06/12/2014, 11:33 AM

## 2014-06-12 NOTE — Progress Notes (Signed)
PPD 2 SVD  S:  Reports feeling Well             Tolerating po/ No nausea or vomiting             Bleeding is spotting             Pain controlled with Motrin and percocet             Up ad lib / ambulatory / voiding QS  Newborn Breast feeding  / newborn in Bili-lights - no DC today (will room-in)  O:               VS: BP 108/57 mmHg  Pulse 64  Temp(Src) 97.5 F (36.4 C) (Oral)  Resp 20  Ht 5\' 7"  (1.702 m)  Wt 81.647 kg (180 lb)  BMI 28.19 kg/m2  SpO2 99%  LMP  (LMP Unknown)  Breastfeeding? Unknown   LABS:              Recent Labs  06/10/14 0653  WBC 26.6*  HGB 12.1  PLT 174               Blood type: --/--/O POS, O POS (01/20 0815)  Rubella: Immune (06/09 0000)                    Physical Exam:             Alert and oriented X3  Lungs: Clear and unlabored  Heart: regular rate and rhythm / no mumurs  Abdomen: soft, non-tender, non-distended              Fundus: firm, non-tender, U-1  Perineum: moderate edema persists  Lochia: light  Extremities: trace edema, no calf pain or tenderness    A: PPD # 2   Doing well - stable status  P: Routine post partum orders  DC home today - will room-in with newborn x 1-2 days  Megan Fernandez, Megan Fernandez CNM, MSN, [facility] 06/12/2014, 10:11 AM

## 2014-06-13 NOTE — Progress Notes (Signed)
Clinical Social Work Department PSYCHOSOCIAL ASSESSMENT - MATERNAL/CHILD 06/13/2014  Patient:  Megan Fernandez, Megan Fernandez  Account Number:  [id number]  Admit Date:  06/09/2014  Ardine Eng Name:   Drucie Ip    Clinical Social Worker:  Nastassia Bazaldua, LCSW   Date/Time:  06/13/2014 02:15 PM  Date Referred:  06/12/2014      Referred reason  Depression/Anxiety   Other referral source:    I:  FAMILY / HOME ENVIRONMENT Child's legal guardian:    Guardian - Name Guardian - Age Guardian - Address  [place] [address]  [place],  [address]  [place]  same as above   Other household support members/support persons Other support:    II  PSYCHOSOCIAL DATA Information Source:    Occupational hygienist Employment:   Both parents are employed   Museum/gallery curator resources:  Multimedia programmer If [employer] / Grade:   Maternity Care Coordinator / Child Services Coordination / Early Interventions:  Cultural issues impacting care:    III  STRENGTHS Strengths  Supportive family/friends  Home prepared for Child (including basic supplies)  Adequate Resources   Strength comment:    IV  RISK FACTORS AND CURRENT PROBLEMS Current Problem:       V  SOCIAL WORK ASSESSMENT Acknowledged order for social work consult to assess mother's hx of depression.  Met with both parents.  They were pleasant and receptive to CSW.  Parents are married and no other dependents.   Mother reports hx with bouts of depression.  She denies any current symptoms or symptoms during pregnancy.  No psychiatric hospitalizations reported.    She reports hx of taking medication in the past for the depression.  She denies any hx of substance abuse.  No acute social concerns related at this time. Informed them of CSW availability.      VI SOCIAL WORK PLAN Social Work Plan  No Further Intervention Required / No Barriers to Discharge   Type of pt/family education:   PP Depression  information and resources   If child protective services report - county:   If child protective services report - date:   Information/referral to community resources comment:   Other social work plan:

## 2015-10-21 DIAGNOSIS — F411 Generalized anxiety disorder: Secondary | ICD-10-CM | POA: Diagnosis not present

## 2015-10-21 DIAGNOSIS — M2550 Pain in unspecified joint: Secondary | ICD-10-CM | POA: Diagnosis not present

## 2015-10-21 DIAGNOSIS — R5383 Other fatigue: Secondary | ICD-10-CM | POA: Diagnosis not present

## 2015-10-21 DIAGNOSIS — J019 Acute sinusitis, unspecified: Secondary | ICD-10-CM | POA: Diagnosis not present

## 2015-11-10 DIAGNOSIS — D225 Melanocytic nevi of trunk: Secondary | ICD-10-CM | POA: Diagnosis not present

## 2015-11-10 DIAGNOSIS — L700 Acne vulgaris: Secondary | ICD-10-CM | POA: Diagnosis not present

## 2015-11-10 DIAGNOSIS — Z85828 Personal history of other malignant neoplasm of skin: Secondary | ICD-10-CM | POA: Diagnosis not present

## 2015-11-10 DIAGNOSIS — L739 Follicular disorder, unspecified: Secondary | ICD-10-CM | POA: Diagnosis not present

## 2015-11-10 DIAGNOSIS — D229 Melanocytic nevi, unspecified: Secondary | ICD-10-CM | POA: Diagnosis not present

## 2015-11-10 DIAGNOSIS — D485 Neoplasm of uncertain behavior of skin: Secondary | ICD-10-CM | POA: Diagnosis not present

## 2015-12-14 DIAGNOSIS — J020 Streptococcal pharyngitis: Secondary | ICD-10-CM | POA: Diagnosis not present

## 2016-01-18 DIAGNOSIS — H5213 Myopia, bilateral: Secondary | ICD-10-CM | POA: Diagnosis not present

## 2016-01-18 DIAGNOSIS — H52222 Regular astigmatism, left eye: Secondary | ICD-10-CM | POA: Diagnosis not present

## 2016-01-31 DIAGNOSIS — Z01419 Encounter for gynecological examination (general) (routine) without abnormal findings: Secondary | ICD-10-CM | POA: Diagnosis not present

## 2016-01-31 DIAGNOSIS — Z6822 Body mass index (BMI) 22.0-22.9, adult: Secondary | ICD-10-CM | POA: Diagnosis not present

## 2016-02-25 DIAGNOSIS — J019 Acute sinusitis, unspecified: Secondary | ICD-10-CM | POA: Diagnosis not present

## 2016-04-08 ENCOUNTER — Emergency Department (HOSPITAL_COMMUNITY)
Admission: EM | Admit: 2016-04-08 | Discharge: 2016-04-08 | Disposition: A | Discharge status: Home / Self Care | Payer: BLUE CROSS/BLUE SHIELD | Attending: Emergency Medicine | Admitting: Emergency Medicine

## 2016-04-08 ENCOUNTER — Emergency Department (HOSPITAL_COMMUNITY): Payer: BLUE CROSS/BLUE SHIELD

## 2016-04-08 ENCOUNTER — Encounter (HOSPITAL_COMMUNITY): Payer: Self-pay | Admitting: *Deleted

## 2016-04-08 DIAGNOSIS — E039 Hypothyroidism, unspecified: Secondary | ICD-10-CM | POA: Insufficient documentation

## 2016-04-08 DIAGNOSIS — R002 Palpitations: Secondary | ICD-10-CM | POA: Insufficient documentation

## 2016-04-08 DIAGNOSIS — R079 Chest pain, unspecified: Secondary | ICD-10-CM | POA: Diagnosis not present

## 2016-04-08 LAB — URINALYSIS, ROUTINE W REFLEX MICROSCOPIC
Bilirubin Urine: NEGATIVE
Glucose, UA: NEGATIVE mg/dL
KETONES UR: NEGATIVE mg/dL
Nitrite: NEGATIVE
PROTEIN: NEGATIVE mg/dL
Specific Gravity, Urine: 1.012 (ref 1.005–1.030)
pH: 7 (ref 5.0–8.0)

## 2016-04-08 LAB — BASIC METABOLIC PANEL
Anion gap: 6 (ref 5–15)
BUN: 10 mg/dL (ref 6–20)
CALCIUM: 9.2 mg/dL (ref 8.9–10.3)
CHLORIDE: 107 mmol/L (ref 101–111)
CO2: 24 mmol/L (ref 22–32)
CREATININE: 0.86 mg/dL (ref 0.44–1.00)
GFR calc Af Amer: >60 mL/min (ref >60)
GFR calc non Af Amer: >60 mL/min (ref >60)
Glucose, Bld: 88 mg/dL (ref 65–99)
Potassium: 4 mmol/L (ref 3.5–5.1)
SODIUM: 137 mmol/L (ref 135–145)

## 2016-04-08 LAB — POC URINE PREG, ED: Preg Test, Ur: NEGATIVE

## 2016-04-08 LAB — URINE MICROSCOPIC-ADD ON

## 2016-04-08 LAB — CBC
HCT: 41.4 % (ref 36.0–46.0)
Hemoglobin: 14.3 g/dL (ref 12.0–15.0)
MCH: 29.9 pg (ref 26.0–34.0)
MCHC: 34.5 g/dL (ref 30.0–36.0)
MCV: 86.6 fL (ref 78.0–100.0)
PLATELETS: 211 10*3/uL (ref 150–400)
RBC: 4.78 MIL/uL (ref 3.87–5.11)
RDW: 12 % (ref 11.5–15.5)
WBC: 7.9 10*3/uL (ref 4.0–10.5)

## 2016-04-08 LAB — TROPONIN I

## 2016-04-08 LAB — TSH: TSH: 2.63 u[IU]/mL (ref 0.350–4.500)

## 2016-04-08 NOTE — Discharge Instructions (Signed)
[facility] The contact information has been provided in this discharge summary. Call tomorrow to make these arrangements.  Return to the emergency department if your symptoms recur.

## 2016-04-08 NOTE — ED Provider Notes (Signed)
[facility]-EMERGENCY DEPT Provider Note   CSN: [id number] Arrival date & time: 04/08/16  1954     History   Chief Complaint Chief Complaint  Patient presents with  . Tachycardia    HPI Megan Fernandez is a 30 y.o. female.  Patient is a 30 year old female with no significant past medical history. She presents for evaluation of an episode of palpitations, dizziness, near syncope that occurred shortly prior to arrival. She states that while she was at home she developed the sudden onset of her heart racing. She felt very short of breath while this episode was present. This lasted for approximately 5 minutes then resolved. She now feels better, however does feel somewhat fatigued and "wiped out". She denies that this is ever happened to her before. She does report drinking several cups of caffeine per day, but denies drug use.   The history is provided by the patient.    Past Medical History:  Diagnosis Date  . Depression     Patient Active Problem List   Diagnosis Date Noted  . Postpartum state 06/10/2014  . Postpartum care following vaginal delivery (1/21) 06/10/2014  . HYPOTHYROIDISM 04/22/2007    Past Surgical History:  Procedure Laterality Date  . APPENDECTOMY      OB History    Gravida Para Term Preterm AB Living   1 1 1     1    SAB TAB Ectopic Multiple Live Births         0 1       Home Medications    Prior to Admission medications   Medication Sig Start Date End Date Taking? Authorizing Provider  ibuprofen (ADVIL,MOTRIN) 600 MG tablet Take 1 tablet (600 mg total) by mouth every 6 (six) hours. 06/12/14   Marlinda Mikeanya Bailey, CNM  oxyCODONE-acetaminophen (PERCOCET/ROXICET) 5-325 MG per tablet Take 1 tablet by mouth every 4 (four) hours as needed (for pain scale less than 7). 06/12/14   Marlinda Mikeanya Bailey, CNM  Prenatal Vit-Fe Fumarate-FA (PRENATAL MULTIVITAMIN) TABS tablet Take 1 tablet by mouth daily.    Historical Provider, MD    Family History No family history on  file.  Social History Social History  Substance Use Topics  . Smoking status: Never Smoker  . Smokeless tobacco: Never Used  . Alcohol use Yes     Comment: socially     Allergies   Penicillins; Codeine; Doxycycline; and Morphine and related   Review of Systems Review of Systems  All other systems reviewed and are negative.    Physical Exam Updated Vital Signs BP 122/89 (BP Location: Left Arm)   Pulse 79   Temp 97.9 F (36.6 C) (Oral)   Resp 18   LMP 04/03/2016   SpO2 100%   Physical Exam  Constitutional: She is oriented to person, place, and time. She appears well-developed and well-nourished. No distress.  HENT:  Head: Normocephalic and atraumatic.  Eyes: EOM are normal. Pupils are equal, round, and reactive to light.  Neck: Normal range of motion. Neck supple.  Cardiovascular: Normal rate, regular rhythm and normal heart sounds.  Exam reveals no gallop and no friction rub.   No murmur heard. Pulmonary/Chest: Effort normal and breath sounds normal. No respiratory distress. She has no wheezes.  Abdominal: Soft. Bowel sounds are normal. She exhibits no distension. There is no tenderness.  Musculoskeletal: Normal range of motion.  Neurological: She is alert and oriented to person, place, and time. No cranial nerve deficit. She exhibits normal muscle tone. Coordination normal.  Skin: Skin  is warm and dry. She is not diaphoretic.  Nursing note and vitals reviewed.    ED Treatments / Results  Labs (all labs ordered are listed, but only abnormal results are displayed) Labs Reviewed  CBC  BASIC METABOLIC PANEL  TROPONIN I  URINALYSIS, ROUTINE W REFLEX MICROSCOPIC (NOT AT [facility])  TSH  POC URINE PREG, ED    EKG  EKG Interpretation  Date/Time:  Sunday April 08 2016 20:04:34 EST Ventricular Rate:  82 PR Interval:  122 QRS Duration: 86 QT Interval:  380 QTC Calculation: 443 R Axis:   75 Text Interpretation:  Normal sinus rhythm with sinus arrhythmia Normal  ECG Confirmed by Orlandus Borowski  MD, Chamaine Stankus ([id number]) on 04/08/2016 8:51:08 PM       Radiology Dg Chest 2 View  Result Date: 04/08/2016 CLINICAL DATA:  30 year old female with chest pain and tachycardia. Headache and dizziness. Initial encounter. EXAM: CHEST  2 VIEW COMPARISON:  None. FINDINGS: Normal lung volumes. Normal cardiac size and mediastinal contours. Visualized tracheal air column is within normal limits. The lungs are clear. No pneumothorax or pleural effusion. No osseous abnormality identified. IMPRESSION: Negative. No acute cardiopulmonary abnormality. Electronically Signed   By: Odessa FlemingH  Hall M.D.   On: 04/08/2016 20:41    Procedures Procedures (including critical care time)  Medications Ordered in ED Medications - No data to display   Initial Impression / Assessment and Plan / ED Course  I have reviewed the triage vital signs and the nursing notes.  Pertinent labs & imaging results that were available during my care of the patient were reviewed by me and considered in my medical decision making (see chart for details).  Clinical Course     Patient presents after an episode of palpitations and shortness of breath that occurred prior to arrival. This occurred suddenly and without warning. The episode lasted approximately five minutes and made her feel weak, She continues to feel somewhat weak, but symptoms have mostly resolved.   Her physical exam is non-focal and workup is unremarkable including laboratory studies and ekg. Differential includes SVT or other arrhythmia, possible anxiety/panic attack. Doubt ACS or PE. TSH pending.  Will encourage outpatient followup and possible holter monitoring if symptoms recur. She is requesting an appointment with cardiology. [facility] provided. Patient to return if symptoms worsen or recur.  Final Clinical Impressions(s) / ED Diagnoses   Final diagnoses:  None    New Prescriptions New  Prescriptions   No medications on file     Geoffery Lyonsouglas Alvenia Treese, MD 04/09/16 [phone]

## 2016-04-08 NOTE — ED Notes (Signed)
The pt has had some alcohol today with dinner  Some chest pain with the tachycardia but minimal now

## 2016-04-08 NOTE — ED Triage Notes (Signed)
The pt is c/o an episode of  A rapid heart beat feeling hot all over her body no previous history of these episodesdizziness sob approx 30 minutes ago   She is feeling better now.   lmp Tuesday  She is feeling better now

## 2016-04-27 ENCOUNTER — Encounter: Payer: Self-pay | Admitting: Cardiovascular Disease

## 2016-04-27 ENCOUNTER — Ambulatory Visit (INDEPENDENT_AMBULATORY_CARE_PROVIDER_SITE_OTHER): Payer: BLUE CROSS/BLUE SHIELD | Admitting: Cardiovascular Disease

## 2016-04-27 VITALS — BP 98/64 | HR 70 | Ht 67.0 in | Wt 147.0 lb

## 2016-04-27 DIAGNOSIS — I471 Supraventricular tachycardia: Secondary | ICD-10-CM | POA: Diagnosis not present

## 2016-04-27 NOTE — Progress Notes (Signed)
Cardiology Office Note   Date:  04/27/2016   ID:  Megan Fernandez, DOB [birth year], MRN [id number]  PCP:  No PCP Per Patient  Cardiologist:   Kristeen MissPhilip Charnice Zwilling, MD   Chief Complaint  Patient presents with  . Palpitations   Problem List 1. Tachycardia   History of Present Illness: Megan Manemily Parmar is a 30 y.o. female who presents for follow up of tachypalpitations.  Had an episode of tachycardia Nov. 19, 2017 - maybe lasted 5 mintues. Thinks it was irregular.   Very fast. Had chest tightness.   Very short of breath, very weak, dizzy, was not able to walk   Has occasional heart flutters . The tachycardia broke suddenly and she felt much better. Had some residual chest soreness.  Went to the ER .  VS were normal by that time 2-3 hours later .  Exercises on occasion.   Used to run quite a bit.   Not much in the past several months but no limitations when she runs or walks now.    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   ( mood disorders, eating disorders)     Past Medical History:  Diagnosis Date  . Depression     Past Surgical History:  Procedure Laterality Date  . APPENDECTOMY       Current Outpatient Prescriptions  Medication Sig Dispense Refill  . MICROGESTIN FE 1.5/30 1.5-30 MG-MCG tablet Take 1 tablet by mouth daily.  4   No current facility-administered medications for this visit.     Allergies:   Penicillins; Codeine; Doxycycline; and Morphine and related    Social History:  The patient  reports that she has never smoked. She has never used smokeless tobacco. She reports that she drinks alcohol. She reports that she does not use drugs.   Family History:  The patient's family history is not on file.    ROS:  Please see the history of present illness.    Review of Systems: Constitutional:  denies fever, chills, diaphoresis, appetite change and fatigue.  HEENT: denies photophobia, eye pain, redness, hearing loss, ear pain, congestion, sore throat, rhinorrhea,  sneezing, neck pain, neck stiffness and tinnitus.  Respiratory: denies SOB, DOE, cough, chest tightness, and wheezing.  Cardiovascular: denies chest pain, palpitations and leg swelling.  Gastrointestinal: denies nausea, vomiting, abdominal pain, diarrhea, constipation, blood in stool.  Genitourinary: denies dysuria, urgency, frequency, hematuria, flank pain and difficulty urinating.  Musculoskeletal: denies  myalgias, back pain, joint swelling, arthralgias and gait problem.   Skin: denies pallor, rash and wound.  Neurological: denies dizziness, seizures, syncope, weakness, light-headedness, numbness and headaches.   Hematological: denies adenopathy, easy bruising, personal or family bleeding history.  Psychiatric/ Behavioral: denies suicidal ideation, mood changes, confusion, nervousness, sleep disturbance and agitation.       All other systems are reviewed and negative.    PHYSICAL EXAM: VS:  BP 98/64 (BP Location: Right Arm, Cuff Size: Normal)   Pulse 70   Ht 5\' 7"  (1.702 m)   Wt 147 lb (66.7 kg)   LMP 04/03/2016   BMI 23.02 kg/m  , BMI Body mass index is 23.02 kg/m. GEN: Well nourished, well developed, in no acute distress  HEENT: normal  Neck: no JVD, carotid bruits, or masses Cardiac: RRR; no murmurs, rubs, or gallops,no edema  Respiratory:  clear to auscultation bilaterally, normal work of breathing GI: soft, nontender, nondistended, + BS MS: no deformity or atrophy  Skin: warm and dry, no rash Neuro:  Strength and sensation  are intact Psych: normal   EKG:  EKG is not ordered today. The ekg ordered Oct. 19 , 2017  demonstrates NSR , normal PR, no significant ST abn.    Recent Labs: 04/08/2016: BUN 10; Creatinine, Ser 0.86; Hemoglobin 14.3; Platelets 211; Potassium 4.0; Sodium 137; TSH 2.630    Lipid Panel    Component Value Date/Time   CHOL 140 04/22/2007 1035   TRIG 72 04/22/2007 1035   HDL 78.0 04/22/2007 1035   CHOLHDL 1.8 CALC 04/22/2007 1035   VLDL 14  04/22/2007 1035   LDLCALC 48 04/22/2007 1035      Wt Readings from Last 3 Encounters:  04/27/16 147 lb (66.7 kg)  06/09/14 180 lb (81.6 kg)  05/23/14 166 lb 12.8 oz (75.7 kg)      Other studies Reviewed: Additional studies/ records that were reviewed today include: . Review of the above records demonstrates:    ASSESSMENT AND PLAN:  1.  Probable supraventricular tachycardia: Irving Burtonmily presents with symptoms that are likely due to SVT. We discussed the possible causes and discussed and discussed things that can bring out the SVT. She will try to minimize her caffeine intake and try to get more sleep. We discussed using the Valsalva maneuver and the diving reflex to terminate the tachycardia. We also briefly discussed carotid sinus massage.   We discussed possibly using propranolol if she starts having further episodes of SVT that are difficult to control.  Her blood pressure is on the low side. He discussed starting a V8 juice once a day.  At this point she is not having enough tachycardia to warrant monitoring. She will give me a call if she has worsening episodes of tachycardia. We discussed our for ablation but I would not recommend that this point since her episodes have been very rare and set she is a young healthy female.  She can continue to do all of her normal activities. I'll see her again in 6 months for follow-up evaluation.  2.  Generalized weakness and mild chest pain. I think that she may be having some generalized fatigue from lack of sleep. I've recommended that she drink a V8 juice daily to see if that helps. She eats a fairly good balance diet. Her chest pain is very atypical. She's able to do trail runs for 30 minutes a time and so I do not think that she has any significant cardiac issues.  Current medicines are reviewed at length with the patient today.  The patient does not have concerns regarding medicines.  Labs/ tests ordered today include:  No orders of the  defined types were placed in this encounter.    Disposition:   FU with me in 6 months      Kristeen MissPhilip Jesly Hartmann, MD  04/27/2016 3:43 PM    [facility] [address], [place], [place]  [address] Phone: [phone]; Fax: [phone]

## 2016-04-27 NOTE — Patient Instructions (Signed)
Medication Instructions:  Your physician recommends that you continue on your current medications as directed. Please refer to the Current Medication list given to you today.   Labwork: none  Testing/Procedures: none  Follow-Up: Your physician wants you to follow-up in: 6 months with Dr. Nahser.  You will receive a reminder letter in the mail two months in advance. If you don't receive a letter, please call our office to schedule the follow-up appointment.   Any Other Special Instructions Will Be Listed Below (If Applicable).     If you need a refill on your cardiac medications before your next appointment, please call your pharmacy.   

## 2016-06-09 DIAGNOSIS — J019 Acute sinusitis, unspecified: Secondary | ICD-10-CM | POA: Diagnosis not present

## 2016-06-28 DIAGNOSIS — F411 Generalized anxiety disorder: Secondary | ICD-10-CM | POA: Diagnosis not present

## 2016-07-05 DIAGNOSIS — F411 Generalized anxiety disorder: Secondary | ICD-10-CM | POA: Diagnosis not present

## 2016-07-12 DIAGNOSIS — F411 Generalized anxiety disorder: Secondary | ICD-10-CM | POA: Diagnosis not present

## 2016-07-16 ENCOUNTER — Encounter: Payer: Self-pay | Admitting: Family Medicine

## 2016-07-16 ENCOUNTER — Ambulatory Visit (INDEPENDENT_AMBULATORY_CARE_PROVIDER_SITE_OTHER): Payer: BLUE CROSS/BLUE SHIELD | Admitting: Family Medicine

## 2016-07-16 VITALS — BP 112/74 | HR 70 | Temp 97.9°F | Ht 65.55 in | Wt 145.4 lb

## 2016-07-16 DIAGNOSIS — E559 Vitamin D deficiency, unspecified: Secondary | ICD-10-CM | POA: Diagnosis not present

## 2016-07-16 DIAGNOSIS — R7989 Other specified abnormal findings of blood chemistry: Secondary | ICD-10-CM | POA: Diagnosis not present

## 2016-07-16 DIAGNOSIS — I471 Supraventricular tachycardia, unspecified: Secondary | ICD-10-CM | POA: Insufficient documentation

## 2016-07-16 DIAGNOSIS — E038 Other specified hypothyroidism: Secondary | ICD-10-CM

## 2016-07-16 DIAGNOSIS — Z8659 Personal history of other mental and behavioral disorders: Secondary | ICD-10-CM | POA: Diagnosis not present

## 2016-07-16 DIAGNOSIS — E039 Hypothyroidism, unspecified: Secondary | ICD-10-CM | POA: Diagnosis not present

## 2016-07-16 DIAGNOSIS — R5383 Other fatigue: Secondary | ICD-10-CM | POA: Diagnosis not present

## 2016-07-16 DIAGNOSIS — E538 Deficiency of other specified B group vitamins: Secondary | ICD-10-CM

## 2016-07-16 LAB — COMPREHENSIVE METABOLIC PANEL
ALT: 15 U/L (ref 0–35)
AST: 15 U/L (ref 0–37)
Albumin: 3.8 g/dL (ref 3.5–5.2)
Alkaline Phosphatase: 38 U/L — ABNORMAL LOW (ref 39–117)
BUN: 11 mg/dL (ref 6–23)
CO2: 24 mEq/L (ref 19–32)
Calcium: 8.9 mg/dL (ref 8.4–10.5)
Chloride: 109 mEq/L (ref 96–112)
Creatinine, Ser: 0.78 mg/dL (ref 0.40–1.20)
GFR: 90.52 mL/min (ref >60.00)
Glucose, Bld: 88 mg/dL (ref 70–99)
Potassium: 4.2 mEq/L (ref 3.5–5.1)
Sodium: 139 mEq/L (ref 135–145)
Total Bilirubin: 0.5 mg/dL (ref 0.2–1.2)
Total Protein: 6.5 g/dL (ref 6.0–8.3)

## 2016-07-16 LAB — CBC WITH DIFFERENTIAL/PLATELET
Basophils Absolute: 0 10*3/uL (ref 0.0–0.1)
Basophils Relative: 0.6 % (ref 0.0–3.0)
Eosinophils Absolute: 0 10*3/uL (ref 0.0–0.7)
Eosinophils Relative: 0.5 % (ref 0.0–5.0)
HCT: 40 % (ref 36.0–46.0)
Hemoglobin: 13.7 g/dL (ref 12.0–15.0)
Lymphocytes Relative: 47.1 % — ABNORMAL HIGH (ref 12.0–46.0)
Lymphs Abs: 2.6 10*3/uL (ref 0.7–4.0)
MCHC: 34.2 g/dL (ref 30.0–36.0)
MCV: 89.2 fl (ref 78.0–100.0)
Monocytes Absolute: 0.4 10*3/uL (ref 0.1–1.0)
Monocytes Relative: 6.7 % (ref 3.0–12.0)
Neutro Abs: 2.5 10*3/uL (ref 1.4–7.7)
Neutrophils Relative %: 45.1 % (ref 43.0–77.0)
Platelets: 201 10*3/uL (ref 150.0–400.0)
RBC: 4.48 Mil/uL (ref 3.87–5.11)
RDW: 13.1 % (ref 11.5–15.5)
WBC: 5.5 10*3/uL (ref 4.0–10.5)

## 2016-07-16 LAB — TSH: TSH: 1.36 u[IU]/mL (ref 0.35–4.50)

## 2016-07-16 LAB — VITAMIN B12: Vitamin B-12: 205 pg/mL — ABNORMAL LOW (ref 211–911)

## 2016-07-16 LAB — T4, FREE: Free T4: 0.99 ng/dL (ref 0.60–1.60)

## 2016-07-16 LAB — VITAMIN D 25 HYDROXY (VIT D DEFICIENCY, FRACTURES): VITD: 23.55 ng/mL — ABNORMAL LOW (ref 30.00–100.00)

## 2016-07-16 LAB — MONONUCLEOSIS SCREEN: Mono Screen: NEGATIVE

## 2016-07-16 NOTE — Progress Notes (Signed)
Pre visit review using our clinic review tool, if applicable. No additional management support is needed unless otherwise documented below in the visit note. 

## 2016-07-16 NOTE — Progress Notes (Signed)
Megan Fernandez is a 31 y.o. female [facility]   History of Present Illness:    1. Fatigue, unspecified type. Worse over the past few weeks. Wants to sleep all of the time. Wants to make sure nothing else going on, so requesting labs done. Hx of depression (see below), that usually presents as insomnia.    2. Subclinical hypothyroidism. Hx of. Last checked 2 years ago.    3. Hx of major depression. Previously tried Prozac - did not like, Wellbutrin with prn Xanax - worked fairly well for her at the time. No SI/HI. No high risk behavior. Seeing behavioral medicine (and is one herself). Admits to burnout. Not exercising or eating healthfully.    There are no preventive care reminders to display for this patient.  PMHx, SurgHx, SocialHx, Medications, and Allergies were reviewed in the Visit Navigator and updated as appropriate.    Past Medical History:  Diagnosis Date  . Anemia   . Anxiety   . Depression   . Seizures (HCC) in Childhood   . Subclinical hypothyroidism   . SVT (supraventricular tachycardia) (HCC)     Past Surgical History:  Procedure Laterality Date  . APPENDECTOMY      Family History  Problem Relation Age of Onset  . Mental illness Mother   . Mental illness Sister   . Cancer Paternal Grandfather   . Diabetes Paternal Grandfather     Social History  Substance Use Topics  . Smoking status: Never Smoker  . Smokeless tobacco: Never Used  . Alcohol use Yes     Comment: socially     Current Medications and Allergies:    Current Outpatient Prescriptions:  Marland Kitchen.  MICROGESTIN FE 1.5/30 1.5-30 MG-MCG tablet, Take 1 tablet by mouth daily., Disp: , Rfl: 4   Allergies  Allergen Reactions  . Penicillins Anaphylaxis and Swelling     Tongue swelled as a child  . Codeine Nausea And Vomiting  . Doxycycline Hives    Pt unsure of true allergy  . Morphine And Related Nausea Only     Patient Information Form: Screening and ROS    Do you feel  safe in relationships? yes PHQ-2: positive, see screening  Review of Systems  General:  Negative for unexplained weight loss, fever Skin: Negative for new or changing mole, sore that won't heal HEENT: Negative for trouble hearing, trouble seeing, ringing in ears, mouth sores, hoarseness, change in voice, dysphagia CV:  Negative for chest pain, dyspnea, edema, palpitations Resp: Negative for cough, dyspnea, hemoptysis GI: Negative for nausea, vomiting, diarrhea, abdominal pain, melena, hematochezia GU: Negative for dysuria, incontinence, urinary hesitance, hematuria, vaginal discharge, polyuria, sexual difficulty, lumps in breasts MSK: Negative for muscle cramps or aches, joint pain or swelling Neuro: Negative for headaches, numbness, dizziness, passing out/fainting Psych: Negative for memory problems   Vitals:   Vitals:   07/16/16 0748  BP: 112/74  Pulse: 70  Temp: 97.9 F (36.6 C)  TempSrc: Oral  SpO2: 97%  Weight: 145 lb 6.4 oz (66 kg)  Height: 5' 5.55" (1.665 m)     Body mass index is 23.79 kg/m.   Physical Exam:   General: Alert, cooperative, appears stated age and no distress.  HEENT:  Normocephalic, without obvious abnormality, atraumatic. Conjunctivae/corneas clear. PERRL, EOM's intact. Normal TM's and external ear canals both ears. Nares normal. Septum midline. Mucosa normal. No drainage or sinus tenderness. Lips, mucosa, and tongue normal; teeth and gums normal.  Lungs: Clear to auscultation bilaterally.  Heart:: Regular rate and rhythm, S1, S2 normal, no murmur, click, rub or gallop.  Abdomen: Soft, non-tender; bowel sounds normal; no masses,  no organomegaly.  Extremities: Extremities normal, atraumatic, no cyanosis or edema.  Pulses: 2+ and symmetric.  Skin: Skin color, texture, turgor normal. No rashes or lesions.  Neurologic: Alert and oriented X 3, normal strength and tone. Normal symmetric. reflexes. Normal coordination and gait.  Psych: Alert,oriented, in  NAD with a full range of affect, normal behavior and no psychotic features    Assessment and Plan:    [name] was seen today for establish care and fatigue.  Diagnoses and all orders for this visit:  Fatigue, unspecified type Comments: Will check labs for organic causes of fatigue. Orders: -     CBC with Differential/Platelet -     Comprehensive metabolic panel -     Vitamin B12 -     Monospot  Subclinical hypothyroidism  Low vitamin D level -     T4, free -     TSH -     VITAMIN D 25 Hydroxy (Vit-D Deficiency, Fractures)  Hx of major depression Comments: If labs above are WNL, patient would like to discuss starting a medication. Leaning towards Wellbutrin for activating effects. Still has plenty of Xanax for prn insomnia.   . Reviewed expectations re: course of current medical issues. . Discussed self-management of symptoms. . Outlined signs and symptoms indicating need for more acute intervention. . Patient verbalized understanding and all questions were answered. . See orders for this visit as documented in the electronic medical record. . Patient received an After Visit Summary.  Records requested if needed. I spent 45 minutes with this patient, greater than 50% was face-to-face time counseling regarding the above diagnoses.   Helane Rima, D.O. [place] 07/16/2016   Follow-up: Will decide after lab work available.  No orders of the defined types were placed in this encounter.  There are no discontinued medications. Orders Placed This Encounter  Procedures  . CBC with Differential/Platelet  . Comprehensive metabolic panel  . T4, free  . TSH  . Vitamin B12  . VITAMIN D 25 Hydroxy (Vit-D Deficiency, Fractures)  . Monospot

## 2016-07-17 ENCOUNTER — Telehealth: Payer: Self-pay | Admitting: Family Medicine

## 2016-07-17 MED ORDER — CHOLECALCIFEROL 1.25 MG (50000 UT) PO TABS: ORAL_TABLET | ORAL | 0 refills | Status: DC

## 2016-07-17 NOTE — Progress Notes (Signed)
Results for orders placed or performed in visit on 07/16/16  CBC with Differential/Platelet  Result Value Ref Range   WBC 5.5 4.0 - 10.5 K/uL   RBC 4.48 3.87 - 5.11 Mil/uL   Hemoglobin 13.7 12.0 - 15.0 g/dL   HCT 16.140.0 09.636.0 - 04.546.0 %   MCV 89.2 78.0 - 100.0 fl   MCHC 34.2 30.0 - 36.0 g/dL   RDW 40.913.1 81.111.5 - 91.415.5 %   Platelets 201.0 150.0 - 400.0 K/uL   Neutrophils Relative % 45.1 43.0 - 77.0 %   Lymphocytes Relative 47.1 (H) 12.0 - 46.0 %   Monocytes Relative 6.7 3.0 - 12.0 %   Eosinophils Relative 0.5 0.0 - 5.0 %   Basophils Relative 0.6 0.0 - 3.0 %   Neutro Abs 2.5 1.4 - 7.7 K/uL   Lymphs Abs 2.6 0.7 - 4.0 K/uL   Monocytes Absolute 0.4 0.1 - 1.0 K/uL   Eosinophils Absolute 0.0 0.0 - 0.7 K/uL   Basophils Absolute 0.0 0.0 - 0.1 K/uL  Comprehensive metabolic panel  Result Value Ref Range   Sodium 139 135 - 145 mEq/L   Potassium 4.2 3.5 - 5.1 mEq/L   Chloride 109 96 - 112 mEq/L   CO2 24 19 - 32 mEq/L   Glucose, Bld 88 70 - 99 mg/dL   BUN 11 6 - 23 mg/dL   Creatinine, Ser 7.820.78 0.40 - 1.20 mg/dL   Total Bilirubin 0.5 0.2 - 1.2 mg/dL   Alkaline Phosphatase 38 (L) 39 - 117 U/L   AST 15 0 - 37 U/L   ALT 15 0 - 35 U/L   Total Protein 6.5 6.0 - 8.3 g/dL   Albumin 3.8 3.5 - 5.2 g/dL   Calcium 8.9 8.4 - 95.610.5 mg/dL   GFR 21.3090.52 >86.57>60.00 mL/min  T4, free  Result Value Ref Range   Free T4 0.99 0.60 - 1.60 ng/dL  TSH  Result Value Ref Range   TSH 1.36 0.35 - 4.50 uIU/mL  Vitamin B12  Result Value Ref Range   Vitamin B-12 205 (L) 211 - 911 pg/mL  VITAMIN D 25 Hydroxy (Vit-D Deficiency, Fractures)  Result Value Ref Range   VITD 23.55 (L) 30.00 - 100.00 ng/mL  Monospot  Result Value Ref Range   Mono Screen Negative Negative   Low vitamin D level -     T4, free -     TSH -     VITAMIN D 25 Hydroxy (Vit-D Deficiency, Fractures) -     Cholecalciferol 50000 units TABS; 50,000 units PO qwk for 8 weeks.  Low serum vitamin B12       - Will offer B12 injections.  Helane RimaErica Lorell Thibodaux,  DO [place] [facility]

## 2016-07-17 NOTE — Addendum Note (Signed)
Addended by: Helane RimaWALLACE, Ardyn Forge R on: 07/17/2016 08:18 PM   Modules accepted: Orders

## 2016-07-17 NOTE — Telephone Encounter (Signed)
Patient says it is okay to leave detailed message on [phone] with lab results once Dr. Earlene PlaterWallace receives them. 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

## 2016-07-18 ENCOUNTER — Telehealth: Payer: Self-pay | Admitting: Family Medicine

## 2016-07-18 NOTE — Telephone Encounter (Signed)
Left detailed message on patients voicemail about labs. Please see result note for more information.

## 2016-07-18 NOTE — Telephone Encounter (Signed)
LM for patient to return call or she can schedule an appointment to come in and discuss questions with doctor. I advised that there is to much to go over in a voicemail.

## 2016-07-18 NOTE — Telephone Encounter (Signed)
Patient returning phone call about results. Patient has additional questions about Vitamin B injection such as:  -How often will they need to be done? -Will insurance cover the injection?  Patient has many additional questions about injection before making a decision on receiving the injection.   Please call patient back to discuss. 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

## 2016-07-18 NOTE — Telephone Encounter (Signed)
Spoke with patient and she is going to do the B12 injections. She is wanting to discuss the Wellbutrin more. I have scheduled her for a follow up appointment on 07/23/16. Will get first B12 on that day.

## 2016-07-18 NOTE — Telephone Encounter (Signed)
Left detailed message on patients phone about lab results. Please see result note.

## 2016-07-18 NOTE — Telephone Encounter (Signed)
Patient called seeking information about labs. Stated leaving a detailed message was fine.

## 2016-07-18 NOTE — Telephone Encounter (Signed)
Patient may call back to set up nurse visit for B12 injections and let us know about Wellbutrin RX.

## 2016-07-19 DIAGNOSIS — F411 Generalized anxiety disorder: Secondary | ICD-10-CM | POA: Diagnosis not present

## 2016-07-23 ENCOUNTER — Ambulatory Visit (INDEPENDENT_AMBULATORY_CARE_PROVIDER_SITE_OTHER): Payer: BLUE CROSS/BLUE SHIELD | Admitting: Family Medicine

## 2016-07-23 ENCOUNTER — Encounter: Payer: Self-pay | Admitting: Family Medicine

## 2016-07-23 VITALS — BP 100/66 | HR 58 | Temp 97.9°F | Ht 66.0 in | Wt 145.2 lb

## 2016-07-23 DIAGNOSIS — F339 Major depressive disorder, recurrent, unspecified: Secondary | ICD-10-CM | POA: Diagnosis not present

## 2016-07-23 DIAGNOSIS — E559 Vitamin D deficiency, unspecified: Secondary | ICD-10-CM | POA: Insufficient documentation

## 2016-07-23 DIAGNOSIS — E538 Deficiency of other specified B group vitamins: Secondary | ICD-10-CM | POA: Diagnosis not present

## 2016-07-23 MED ORDER — FLUOXETINE HCL 20 MG PO CAPS: 20.0000 mg | ORAL_CAPSULE | Freq: Every morning | ORAL | 3 refills | Status: AC

## 2016-07-23 MED ORDER — CYANOCOBALAMIN 1000 MCG/ML IJ SOLN
1000.0000 ug | Freq: Once | INTRAMUSCULAR | Status: AC
  Administered 2016-07-23: 1000 ug via INTRAMUSCULAR

## 2016-07-23 MED ORDER — BUPROPION HCL ER (SR) 150 MG PO TB12: ORAL_TABLET | ORAL | 2 refills | Status: DC

## 2016-07-23 NOTE — Progress Notes (Signed)
Pre visit review using our clinic review tool, if applicable. No additional management support is needed unless otherwise documented below in the visit note. 

## 2016-07-23 NOTE — Patient Instructions (Signed)
   Results for orders placed or performed in visit on 07/16/16  CBC with Differential/Platelet  Result Value Ref Range   WBC 5.5 4.0 - 10.5 K/uL   RBC 4.48 3.87 - 5.11 Mil/uL   Hemoglobin 13.7 12.0 - 15.0 g/dL   HCT 09.840.0 11.936.0 - 14.746.0 %   MCV 89.2 78.0 - 100.0 fl   MCHC 34.2 30.0 - 36.0 g/dL   RDW 82.913.1 56.211.5 - 13.015.5 %   Platelets 201.0 150.0 - 400.0 K/uL   Neutrophils Relative % 45.1 43.0 - 77.0 %   Lymphocytes Relative 47.1 (H) 12.0 - 46.0 %   Monocytes Relative 6.7 3.0 - 12.0 %   Eosinophils Relative 0.5 0.0 - 5.0 %   Basophils Relative 0.6 0.0 - 3.0 %   Neutro Abs 2.5 1.4 - 7.7 K/uL   Lymphs Abs 2.6 0.7 - 4.0 K/uL   Monocytes Absolute 0.4 0.1 - 1.0 K/uL   Eosinophils Absolute 0.0 0.0 - 0.7 K/uL   Basophils Absolute 0.0 0.0 - 0.1 K/uL  Comprehensive metabolic panel  Result Value Ref Range   Sodium 139 135 - 145 mEq/L   Potassium 4.2 3.5 - 5.1 mEq/L   Chloride 109 96 - 112 mEq/L   CO2 24 19 - 32 mEq/L   Glucose, Bld 88 70 - 99 mg/dL   BUN 11 6 - 23 mg/dL   Creatinine, Ser 8.650.78 0.40 - 1.20 mg/dL   Total Bilirubin 0.5 0.2 - 1.2 mg/dL   Alkaline Phosphatase 38 (L) 39 - 117 U/L   AST 15 0 - 37 U/L   ALT 15 0 - 35 U/L   Total Protein 6.5 6.0 - 8.3 g/dL   Albumin 3.8 3.5 - 5.2 g/dL   Calcium 8.9 8.4 - 78.410.5 mg/dL   GFR 69.6290.52 >95.28>60.00 mL/min  T4, free  Result Value Ref Range   Free T4 0.99 0.60 - 1.60 ng/dL  TSH  Result Value Ref Range   TSH 1.36 0.35 - 4.50 uIU/mL  Vitamin B12  Result Value Ref Range   Vitamin B-12 205 (L) 211 - 911 pg/mL  VITAMIN D 25 Hydroxy (Vit-D Deficiency, Fractures)  Result Value Ref Range   VITD 23.55 (L) 30.00 - 100.00 ng/mL  Monospot  Result Value Ref Range   Mono Screen Negative Negative

## 2016-07-23 NOTE — Progress Notes (Signed)
Megan Fernandez is a 31 y.o. female is here to discuss:  History of Present Illness:   1. Depression, recurrent (HCC). Patient presents for recheck of her depression. She states that she is working on making time for herself. She started taking Wellbutrin that she had at home from a previous episode of depression. She is working on marriage counseling and seeing her own therapist as well. She continues to have issues with sleeping. She wishes to start an SSRI today. No SI/HI.    2. B12 deficiency.   Lab Results  Component Value Date   VITAMINB12 205 (L) 07/16/2016    3. Vitamin D deficiency. Started high dose supplementation last week.   PMHx, SurgHx, SocialHx, FamHx, Medications, and Allergies were reviewed in the Visit Navigator and updated as appropriate.   Patient Active Problem List   Diagnosis Date Noted  . Depression, recurrent (HCC) 07/23/2016  . B12 deficiency 07/23/2016  . Vitamin D deficiency 07/23/2016  . SVT (supraventricular tachycardia) (HCC) 07/16/2016  . Subclinical hypothyroidism 04/22/2007   Social History  Substance Use Topics  . Smoking status: Never Smoker  . Smokeless tobacco: Never Used  . Alcohol use Yes     Comment: socially   Current Medications and Allergies:   .  Cholecalciferol 50000 units TABS, 50,000 units PO qwk for 8 weeks., Disp: 12 tablet, Rfl: 0 .  MICROGESTIN FE 1.5/30 1.5-30 MG-MCG tablet, Take 1 tablet by mouth daily., Disp: , Rfl: 4 .  buPROPion (WELLBUTRIN SR) 150 MG 12 hr tablet, Take one tablet by mouth daily for 3 days, and then increase to one tablet by mouth twice daily., Disp: 60 tablet, Rfl: 2   Allergies  Allergen Reactions  . Penicillins Anaphylaxis and Swelling  . Codeine Nausea And Vomiting  . Doxycycline Hives  . Morphine And Related Nausea Only    Review of Systems   Review of Systems: The patient denies chills, fever, weight loss/gain, fatigue, lack of appetite, difficulty swallowing, sore throat, earache,  post-nasal drip, chest pain, palpitations, changes in blood pressures, swelling of legs, cough, dyspnea, wheezing, change in bowel habits, nausea, vomiting, changes in urination, joint or muscle pain, skin changes, dizziness, headaches, numbness, changes in balance or coordination, memory changes, swollen glands, easy bruising.    Vitals:   Vitals:   07/23/16 1138  BP: 100/66  Pulse: (!) 58  Temp: 97.9 F (36.6 C)  TempSrc: Oral  SpO2: 98%  Weight: 145 lb 3.2 oz (65.9 kg)  Height: 5\' 6"  (1.676 m)     Body mass index is 23.44 kg/m.   Physical Exam:     General Appearance:    Alert, cooperative, no distress, appears stated age  Lungs:     Clear to auscultation bilaterally, respirations unlabored  Chest Wall:    No tenderness or deformity   Heart:    Regular rate and rhythm, S1 and S2 normal, no murmur, rub   or gallop  Extremities:   Extremities normal, atraumatic, no cyanosis or edema  Pulses:   2+ and symmetric all extremities  Skin:   Skin color, texture, turgor normal, no rashes or lesions  Neurologic:   CNII-XII intact, normal strength, sensation and reflexes    throughout   Results for orders placed or performed in visit on 07/16/16  CBC with Differential/Platelet  Result Value Ref Range   WBC 5.5 4.0 - 10.5 K/uL   RBC 4.48 3.87 - 5.11 Mil/uL   Hemoglobin 13.7 12.0 - 15.0 g/dL   HCT  40.0 36.0 - 46.0 %   MCV 89.2 78.0 - 100.0 fl   MCHC 34.2 30.0 - 36.0 g/dL   RDW 16.1 09.6 - 04.5 %   Platelets 201.0 150.0 - 400.0 K/uL   Neutrophils Relative % 45.1 43.0 - 77.0 %   Lymphocytes Relative 47.1 (H) 12.0 - 46.0 %   Monocytes Relative 6.7 3.0 - 12.0 %   Eosinophils Relative 0.5 0.0 - 5.0 %   Basophils Relative 0.6 0.0 - 3.0 %   Neutro Abs 2.5 1.4 - 7.7 K/uL   Lymphs Abs 2.6 0.7 - 4.0 K/uL   Monocytes Absolute 0.4 0.1 - 1.0 K/uL   Eosinophils Absolute 0.0 0.0 - 0.7 K/uL   Basophils Absolute 0.0 0.0 - 0.1 K/uL  Comprehensive metabolic panel  Result Value Ref Range    Sodium 139 135 - 145 mEq/L   Potassium 4.2 3.5 - 5.1 mEq/L   Chloride 109 96 - 112 mEq/L   CO2 24 19 - 32 mEq/L   Glucose, Bld 88 70 - 99 mg/dL   BUN 11 6 - 23 mg/dL   Creatinine, Ser 4.09 0.40 - 1.20 mg/dL   Total Bilirubin 0.5 0.2 - 1.2 mg/dL   Alkaline Phosphatase 38 (L) 39 - 117 U/L   AST 15 0 - 37 U/L   ALT 15 0 - 35 U/L   Total Protein 6.5 6.0 - 8.3 g/dL   Albumin 3.8 3.5 - 5.2 g/dL   Calcium 8.9 8.4 - 81.1 mg/dL   GFR 91.47 >82.95 mL/min  T4, free  Result Value Ref Range   Free T4 0.99 0.60 - 1.60 ng/dL  TSH  Result Value Ref Range   TSH 1.36 0.35 - 4.50 uIU/mL  Vitamin B12  Result Value Ref Range   Vitamin B-12 205 (L) 211 - 911 pg/mL  VITAMIN D 25 Hydroxy (Vit-D Deficiency, Fractures)  Result Value Ref Range   VITD 23.55 (L) 30.00 - 100.00 ng/mL  Monospot  Result Value Ref Range   Mono Screen Negative Negative    Assessment and Plan:   Megan Fernandez was seen today for follow-up.  Diagnoses and all orders for this visit:  Depression, recurrent (HCC) Comments: Patient was given information on SSRIs and possible side effects were reviewed. She was asked to contact us with any worsening in symptoms or suicidal thoughts and we discussed that it would take 2-4 weeks to begin to see improvement in her symptoms.  Orders: -     FLUoxetine (PROZAC) 20 MG capsule; Take 1 capsule (20 mg total) by mouth every morning. -     buPROPion (WELLBUTRIN SR) 150 MG 12 hr tablet; Take one tablet by mouth daily for 3 days, and then increase to one tablet by mouth twice daily.  B12 deficiency Comments: IM supplementation daily x 3 days, then weekly x 4 weeks, then orally.  Orders: -     cyanocobalamin ((VITAMIN B-12)) injection 1,000 mcg; Inject 1 mL (1,000 mcg total) into the muscle once.  Vitamin D deficiency Comments: Oral supplementation started last week.   . Reviewed expectations re: course of current medical issues. . Discussed self-management of symptoms. . Outlined signs  and symptoms indicating need for more acute intervention. . Patient verbalized understanding and all questions were answered. . See orders for this visit as documented in the electronic medical record. . Patient received an After Visit Summary.   Megan Fernandez, D.O. [place] 07/23/2016   Follow-up: Return in about 2 months (around 09/22/2016).  Meds ordered this encounter  Medications  . FLUoxetine (PROZAC) 20 MG capsule    Sig: Take 1 capsule (20 mg total) by mouth every morning.    Dispense:  90 capsule    Refill:  3  . buPROPion (WELLBUTRIN SR) 150 MG 12 hr tablet    Sig: Take one tablet by mouth daily for 3 days, and then increase to one tablet by mouth twice daily.    Dispense:  60 tablet    Refill:  2  . cyanocobalamin ((VITAMIN B-12)) injection 1,000 mcg    No orders of the defined types were placed in this encounter.

## 2016-07-24 ENCOUNTER — Ambulatory Visit (INDEPENDENT_AMBULATORY_CARE_PROVIDER_SITE_OTHER): Payer: BLUE CROSS/BLUE SHIELD | Admitting: *Deleted

## 2016-07-24 DIAGNOSIS — E538 Deficiency of other specified B group vitamins: Secondary | ICD-10-CM

## 2016-07-24 MED ORDER — CYANOCOBALAMIN 1000 MCG/ML IJ SOLN
1000.0000 ug | Freq: Once | INTRAMUSCULAR | Status: AC
  Administered 2016-07-24: 1000 ug via INTRAMUSCULAR

## 2016-07-25 ENCOUNTER — Ambulatory Visit (INDEPENDENT_AMBULATORY_CARE_PROVIDER_SITE_OTHER): Payer: BLUE CROSS/BLUE SHIELD | Admitting: Surgical

## 2016-07-25 DIAGNOSIS — E538 Deficiency of other specified B group vitamins: Secondary | ICD-10-CM | POA: Diagnosis not present

## 2016-07-25 MED ORDER — CYANOCOBALAMIN 1000 MCG/ML IJ SOLN
1000.0000 ug | Freq: Once | INTRAMUSCULAR | Status: AC
  Administered 2016-07-25: 1000 ug via INTRAMUSCULAR

## 2016-07-25 NOTE — Progress Notes (Signed)
Patient came in today for B12 injection . B 12 injection was given in right deltoid. Patient tolerated well.

## 2016-07-26 DIAGNOSIS — F411 Generalized anxiety disorder: Secondary | ICD-10-CM | POA: Diagnosis not present

## 2016-07-31 ENCOUNTER — Ambulatory Visit: Payer: BLUE CROSS/BLUE SHIELD

## 2016-08-01 ENCOUNTER — Ambulatory Visit (INDEPENDENT_AMBULATORY_CARE_PROVIDER_SITE_OTHER): Payer: BLUE CROSS/BLUE SHIELD | Admitting: *Deleted

## 2016-08-01 DIAGNOSIS — E538 Deficiency of other specified B group vitamins: Secondary | ICD-10-CM

## 2016-08-01 MED ORDER — CYANOCOBALAMIN 1000 MCG/ML IJ SOLN
1000.0000 ug | Freq: Once | INTRAMUSCULAR | Status: AC
Start: 2016-08-01 — End: 2016-08-01
  Administered 2016-08-01: 1000 ug via INTRAMUSCULAR

## 2016-08-07 ENCOUNTER — Ambulatory Visit (INDEPENDENT_AMBULATORY_CARE_PROVIDER_SITE_OTHER): Payer: BLUE CROSS/BLUE SHIELD | Admitting: Surgical

## 2016-08-07 DIAGNOSIS — E538 Deficiency of other specified B group vitamins: Secondary | ICD-10-CM

## 2016-08-07 MED ORDER — CYANOCOBALAMIN 1000 MCG/ML IJ SOLN
1000.0000 ug | Freq: Once | INTRAMUSCULAR | Status: AC
  Administered 2016-08-07: 1000 ug via INTRAMUSCULAR

## 2016-08-07 NOTE — Progress Notes (Signed)
Patient came in today for B12 injection. Injection was given in right deltoid and patient tolerated well. Patient stated that she can tell a difference since getting the injections.

## 2016-08-14 ENCOUNTER — Ambulatory Visit (INDEPENDENT_AMBULATORY_CARE_PROVIDER_SITE_OTHER): Payer: BLUE CROSS/BLUE SHIELD | Admitting: Surgical

## 2016-08-14 DIAGNOSIS — E538 Deficiency of other specified B group vitamins: Secondary | ICD-10-CM | POA: Diagnosis not present

## 2016-08-14 MED ORDER — CYANOCOBALAMIN 1000 MCG/ML IJ SOLN
1000.0000 ug | Freq: Once | INTRAMUSCULAR | Status: AC
  Administered 2016-08-14: 1000 ug via INTRAMUSCULAR

## 2016-08-14 NOTE — Progress Notes (Signed)
Patient came in today for B 12 injection. B 12 given left deltoid. Patient tolerated well.

## 2016-08-21 ENCOUNTER — Ambulatory Visit (INDEPENDENT_AMBULATORY_CARE_PROVIDER_SITE_OTHER): Payer: BLUE CROSS/BLUE SHIELD | Admitting: Surgical

## 2016-08-21 DIAGNOSIS — E538 Deficiency of other specified B group vitamins: Secondary | ICD-10-CM | POA: Diagnosis not present

## 2016-08-21 MED ORDER — CYANOCOBALAMIN 1000 MCG/ML IJ SOLN
1000.0000 ug | Freq: Once | INTRAMUSCULAR | Status: AC
  Administered 2016-08-21: 1000 ug via INTRAMUSCULAR

## 2016-08-21 NOTE — Progress Notes (Signed)
Patient came in  Today for B12 injection. Given in right deltoid. Patient tolerated well.

## 2016-08-22 ENCOUNTER — Ambulatory Visit (INDEPENDENT_AMBULATORY_CARE_PROVIDER_SITE_OTHER): Payer: BLUE CROSS/BLUE SHIELD | Admitting: Family Medicine

## 2016-08-22 ENCOUNTER — Encounter: Payer: Self-pay | Admitting: Family Medicine

## 2016-08-22 VITALS — BP 110/68 | HR 67 | Temp 97.5°F | Wt 145.0 lb

## 2016-08-22 DIAGNOSIS — J029 Acute pharyngitis, unspecified: Secondary | ICD-10-CM

## 2016-08-22 LAB — POCT RAPID STREP A (OFFICE): Rapid Strep A Screen: NEGATIVE

## 2016-08-22 MED ORDER — AZITHROMYCIN 500 MG PO TABS: 500.0000 mg | ORAL_TABLET | Freq: Every day | ORAL | 0 refills | Status: AC

## 2016-08-22 NOTE — Progress Notes (Signed)
Megan Fernandez is a 31 y.o. female here for a new problem.  History of Present Illness:   Megan Fernandez CMA acting as scribe for Dr. Earlene Plater   Chief Complaint  Patient presents with  . Sore Throat    Sore Throat   This is a new problem. The current episode started today. The problem has been gradually worsening. The maximum temperature recorded prior to her arrival was 100.4 - 100.9 F. Associated symptoms include ear pain. Pertinent negatives include no abdominal pain, headaches, neck pain, shortness of breath or vomiting. She has had exposure to strep. She has tried nothing for the symptoms.    PMHx, SurgHx, SocialHx, Medications, and Allergies were reviewed in the Visit Navigator and updated as appropriate.  Current Medications:   Current Outpatient Prescriptions:  .  buPROPion (WELLBUTRIN SR) 150 MG 12 hr tablet, Take one tablet by mouth daily for 3 days, and then increase to one tablet by mouth twice daily., Disp: 60 tablet, Rfl: 2 .  Cholecalciferol 50000 units TABS, 50,000 units PO qwk for 8 weeks., Disp: 12 tablet, Rfl: 0 .  FLUoxetine (PROZAC) 20 MG capsule, Take 1 capsule (20 mg total) by mouth every morning., Disp: 90 capsule, Rfl: 3 .  MICROGESTIN FE 1.5/30 1.5-30 MG-MCG tablet, Take 1 tablet by mouth daily., Disp: , Rfl: 4 .  azithromycin (ZITHROMAX) 500 MG tablet, Take 1 tablet (500 mg total) by mouth daily., Disp: 5 tablet, Rfl: 0   Review of Systems:   Review of Systems  Constitutional: Negative for chills, fever and malaise/fatigue.  HENT: Positive for ear pain, sinus pain and sore throat.        Daughter tested positive for strep today.  Eyes: Negative for blurred vision and double vision.  Respiratory: Negative for shortness of breath and wheezing.   Cardiovascular: Negative for chest pain and palpitations.  Gastrointestinal: Negative for abdominal pain and vomiting.  Musculoskeletal: Negative for back pain, joint pain and neck pain.  Neurological: Negative for  dizziness and headaches.  Psychiatric/Behavioral: Negative for depression and memory loss.    Vitals:   Vitals:   08/22/16 1023  BP: 110/68  Pulse: 67  Temp: 97.5 F (36.4 C)  TempSrc: Oral  SpO2: 98%  Weight: 145 lb (65.8 kg)     Body mass index is 23.4 kg/m.  Physical Exam:   Physical Exam  Constitutional: She appears well-nourished.  HENT:  Head: Normocephalic and atraumatic.  Mouth/Throat: Oropharyngeal exudate present.  Eyes: EOM are normal. Pupils are equal, round, and reactive to light.  Neck: Normal range of motion. Neck supple.  Cardiovascular: Normal rate, regular rhythm, normal heart sounds and intact distal pulses.   Pulmonary/Chest: Effort normal.  Abdominal: Soft.  Skin: Skin is warm.  Psychiatric: She has a normal mood and affect. Her behavior is normal.  Nursing note and vitals reviewed.   Results for orders placed or performed in visit on 08/22/16  POCT rapid strep A  Result Value Ref Range   Rapid Strep A Screen Negative Negative    Assessment and Plan:    Megan Fernandez was seen today for sore throat.  Diagnoses and all orders for this visit:  Sore throat -     POCT rapid strep A -     azithromycin (ZITHROMAX) 500 MG tablet; Take 1 tablet (500 mg total) by mouth daily.  . Reviewed expectations re: course of current medical issues. . Discussed self-management of symptoms. . Outlined signs and symptoms indicating need for more acute intervention. Marland Kitchen  Patient verbalized understanding and all questions were answered. . See orders for this visit as documented in the electronic medical record. . Patient received an After-Visit Summary.  CMA served as Neurosurgeon during this visit. History, Physical, and Plan performed by medical provider. Documentation and orders reviewed and attested to. Helane Rima, D.O.   Helane Rima, D.O.

## 2016-08-22 NOTE — Progress Notes (Signed)
Pre visit review using our clinic review tool, if applicable. No additional management support is needed unless otherwise documented below in the visit note. 

## 2016-09-21 ENCOUNTER — Encounter: Payer: Self-pay | Admitting: Family Medicine

## 2016-09-21 ENCOUNTER — Ambulatory Visit (INDEPENDENT_AMBULATORY_CARE_PROVIDER_SITE_OTHER): Payer: BLUE CROSS/BLUE SHIELD | Admitting: Family Medicine

## 2016-09-21 VITALS — BP 104/64 | HR 72 | Temp 97.8°F | Ht 66.0 in | Wt 142.2 lb

## 2016-09-21 DIAGNOSIS — E538 Deficiency of other specified B group vitamins: Secondary | ICD-10-CM

## 2016-09-21 DIAGNOSIS — F339 Major depressive disorder, recurrent, unspecified: Secondary | ICD-10-CM

## 2016-09-21 DIAGNOSIS — Z3041 Encounter for surveillance of contraceptive pills: Secondary | ICD-10-CM

## 2016-09-21 LAB — VITAMIN B12: Vitamin B-12: 372 pg/mL (ref 211–911)

## 2016-09-21 MED ORDER — MICROGESTIN FE 1.5/30 1.5-30 MG-MCG PO TABS: 1.0000 | ORAL_TABLET | Freq: Every day | ORAL | 4 refills | Status: AC

## 2016-09-21 MED ORDER — BUPROPION HCL ER (XL) 300 MG PO TB24: 300.0000 mg | ORAL_TABLET | Freq: Every day | ORAL | 3 refills | Status: DC

## 2016-09-21 NOTE — Progress Notes (Signed)
Megan Fernandez is a 31 y.o. female is here for follow up.  History of Present Illness:   Britt Bottom CMA acting as scribe for Dr. Earlene Plater.  HPI:  1. B12 deficiency. Subjectively improved. Status post B12 injections. Due for lab recheck. Feeling more energetic.    2. Depression, recurrent (HCC). Improved. Patient tried to increase to Wellbutrin 150, taking 2 pills in the morning. She has been on the SR version. She did note tremors. She also had some word finding difficulty. She ultimately went back down to 1 pill in the morning.     3. Encounter for surveillance of contraceptive pills. Due for refill of birth control pills.    There are no preventive care reminders to display for this patient.  PMHx, SurgHx, SocialHx, FamHx, Medications, and Allergies were reviewed in the Visit Navigator and updated as appropriate.   Patient Active Problem List   Diagnosis Date Noted  . Encounter for surveillance of contraceptive pills 09/21/2016  . Depression, recurrent (HCC) 07/23/2016  . B12 deficiency 07/23/2016  . Vitamin D deficiency 07/23/2016  . SVT (supraventricular tachycardia) (HCC) 07/16/2016  . Subclinical hypothyroidism 04/22/2007   Social History  Substance Use Topics  . Smoking status: Never Smoker  . Smokeless tobacco: Never Used  . Alcohol use Yes     Comment: socially   Current Medications and Allergies:   .  buPROPion (WELLBUTRIN SR) 150 MG 12 hr tablet, Take one tablet by mouth daily for 3 days, and then increase to one tablet by mouth twice daily., Disp: 60 tablet, Rfl: 2 .  Cholecalciferol 50000 units TABS, 50,000 units PO qwk for 8 weeks., Disp: 12 tablet, Rfl: 0 .  FLUoxetine (PROZAC) 20 MG capsule, Take 1 capsule (20 mg total) by mouth every morning., Disp: 90 capsule, Rfl: 3 .  MICROGESTIN FE 1.5/30 1.5-30 MG-MCG tablet, Take 1 tablet by mouth daily., Disp: , Rfl: 4  Allergies  Allergen Reactions  . Penicillins Anaphylaxis and Swelling     Tongue swelled as  a child  . Codeine Nausea And Vomiting  . Doxycycline Hives    Pt unsure of true allergy  . Morphine And Related Nausea Only   Review of Systems   Review of Systems  Constitutional: Positive for malaise/fatigue. Negative for chills and fever.  HENT: Positive for tinnitus. Negative for congestion, ear pain, sinus pain and sore throat.   Eyes: Negative for blurred vision and double vision.  Respiratory: Negative for cough, shortness of breath and wheezing.   Cardiovascular: Negative for chest pain, palpitations and leg swelling.  Gastrointestinal: Negative for abdominal pain, constipation and diarrhea.  Genitourinary: Negative for dysuria.  Musculoskeletal: Negative for back pain, joint pain and neck pain.  Neurological: Positive for dizziness and headaches.  Psychiatric/Behavioral: Negative for depression, hallucinations and memory loss.   Vitals:   Vitals:   09/21/16 0740  BP: 104/64  Pulse: 72  Temp: 97.8 F (36.6 C)  TempSrc: Oral  SpO2: 99%  Weight: 142 lb 3.2 oz (64.5 kg)  Height: 5\' 6"  (1.676 m)     Body mass index is 22.95 kg/m.   Physical Exam:    Physical Exam  Constitutional: She appears well-developed and well-nourished. No distress.  HENT:  Head: Normocephalic and atraumatic.  Eyes: EOM are normal. Pupils are equal, round, and reactive to light.  Neck: Normal range of motion. Neck supple.  Cardiovascular: Normal rate, regular rhythm, normal heart sounds and intact distal pulses.   Pulmonary/Chest: Effort normal.  Abdominal: Soft.  Skin: Skin is warm.  Psychiatric: She has a normal mood and affect. Her behavior is normal.  Nursing note and vitals reviewed.    Assessment and Plan:    Diagnoses and all orders for this visit:  B12 deficiency -     Vitamin B12  Depression, recurrent (HCC) -     buPROPion (WELLBUTRIN XL) 300 MG 24 hr tablet; Take 1 tablet (300 mg total) by mouth daily.  Encounter for surveillance of contraceptive pills -      MICROGESTIN FE 1.5/30 1.5-30 MG-MCG tablet; Take 1 tablet by mouth daily.   . Reviewed expectations re: course of current medical issues. . Discussed self-management of symptoms. . Outlined signs and symptoms indicating need for more acute intervention. . Patient verbalized understanding and all questions were answered. . See orders for this visit as documented in the electronic medical record. . Patient received an After Visit Summary.   CMA served as Neurosurgeonscribe during this visit. History, Physical, and Plan performed by medical provider. Documentation and orders reviewed and attested to. Helane RimaErica Ben Habermann, D.O.  Helane RimaErica Marli Diego, D.O. , [facility] 09/21/2016

## 2016-12-17 ENCOUNTER — Telehealth: Payer: Self-pay | Admitting: Family Medicine

## 2016-12-17 ENCOUNTER — Ambulatory Visit (INDEPENDENT_AMBULATORY_CARE_PROVIDER_SITE_OTHER): Payer: BLUE CROSS/BLUE SHIELD | Admitting: Family Medicine

## 2016-12-17 ENCOUNTER — Encounter: Payer: Self-pay | Admitting: Family Medicine

## 2016-12-17 VITALS — BP 118/74 | Temp 98.1°F | Ht 66.0 in | Wt 147.8 lb

## 2016-12-17 DIAGNOSIS — N926 Irregular menstruation, unspecified: Secondary | ICD-10-CM | POA: Diagnosis not present

## 2016-12-17 LAB — HCG, QUANTITATIVE, PREGNANCY: Quantitative HCG: <0.6 m[IU]/mL

## 2016-12-17 NOTE — Telephone Encounter (Signed)
Patient scheduled an appt requesting a blood preg test. Over a week late for period, and several neg OTC tests. She is taking meds that could harm a fetus she is asking to come in for blood test before her appt. I advised I was uncertain on protocol and turnaround time for test itself. Verified mobile is best contact. Please call patient to advise on how to proceed.

## 2016-12-17 NOTE — Telephone Encounter (Signed)
Noted  

## 2016-12-17 NOTE — Progress Notes (Signed)
   Megan Fernandez is a 31 y.o. female here for an acute visit.  History of Present Illness:   Insurance claims handlerAmber Fernandez, CMA, acting as scribe for Dr. Earlene PlaterWallace.  HPI:  Patient states her period is over a week late.  She should have started on 12/09/2016.  She has taken several home pregnancy tests that have been negative.  She is taking medications that can harm a fetus.  She is requesting a blood pregnancy test today.  PMHx, SurgHx, SocialHx, Medications, and Allergies were reviewed in the Visit Navigator and updated as appropriate.  Current Medications:   Current Outpatient Prescriptions:  .  buPROPion (WELLBUTRIN XL) 300 MG 24 hr tablet, Take 1 tablet (300 mg total) by mouth daily., Disp: 90 tablet, Rfl: 3 .  Cholecalciferol 50000 units TABS, 50,000 units PO qwk for 8 weeks., Disp: 12 tablet, Rfl: 0 .  FLUoxetine (PROZAC) 20 MG capsule, Take 1 capsule (20 mg total) by mouth every morning., Disp: 90 capsule, Rfl: 3 .  MICROGESTIN FE 1.5/30 1.5-30 MG-MCG tablet, Take 1 tablet by mouth daily., Disp: 1 Package, Rfl: 4   Allergies  Allergen Reactions  . Penicillins Anaphylaxis and Swelling     Tongue swelled as a child  . Codeine Nausea And Vomiting  . Doxycycline Hives    Pt unsure of true allergy  . Morphine And Related Nausea Only   Review of Systems:   Pertinent items are noted in the HPI. Otherwise, ROS is negative.  Vitals:   Vitals:   12/17/16 1037  BP: 118/74  Temp: 98.1 F (36.7 C)  TempSrc: Oral  SpO2: 97%  Weight: 147 lb 12.8 oz (67 kg)  Height: 5\' 6"  (1.676 m)     Body mass index is 23.86 kg/m.  Physical Exam:   Physical Exam  Constitutional: She appears well-developed and well-nourished. No distress.  Nursing note and vitals reviewed.   Results for orders placed or performed in visit on 12/17/16  hCG, quantitative, pregnancy  Result Value Ref Range   Quantitative HCG <0.60 mIU/ml   Assessment and Plan:   Megan Fernandez was seen today for acute visit.  Diagnoses and  all orders for this visit:  Menstrual period late Comments: Negative pregnancy test. Orders: -     hCG, quantitative, pregnancy    . Reviewed expectations re: course of current medical issues. . Discussed self-management of symptoms. . Outlined signs and symptoms indicating need for more acute intervention. . Patient verbalized understanding and all questions were answered. Marland Kitchen. Health Maintenance issues including appropriate healthy diet, exercise, and smoking avoidance were discussed with patient. . See orders for this visit as documented in the electronic medical record. . Patient received an After Visit Summary.  CMA served as Neurosurgeonscribe during this visit. History, Physical, and Plan performed by medical provider. The above documentation has been reviewed and is accurate and complete. Megan Fernandez, D.O.  Megan RimaErica Ola Fawver, DO [facility], [facility] 12/17/2016  No future appointments.

## 2016-12-17 NOTE — Telephone Encounter (Signed)
Patient scheduled today at 10:30 to see Dr. Earlene PlaterWallace

## 2016-12-17 NOTE — Telephone Encounter (Signed)
Please provide approval for hgc blood test and/or direction on patient's request. Happy to follow-up with patient after direction.

## 2016-12-31 ENCOUNTER — Other Ambulatory Visit: Payer: Self-pay

## 2016-12-31 ENCOUNTER — Telehealth: Payer: Self-pay | Admitting: Family Medicine

## 2016-12-31 DIAGNOSIS — N926 Irregular menstruation, unspecified: Secondary | ICD-10-CM

## 2016-12-31 NOTE — Telephone Encounter (Signed)
Please advise 

## 2016-12-31 NOTE — Telephone Encounter (Signed)
Patient called in to advise that she still has not had her period as of today. This marks at least 1.5 months since LMP. She is asking if another lab should be drawn, or if O/v is needed. Please advise. Verified mobile #. Ok to leave detailed vm

## 2016-12-31 NOTE — Telephone Encounter (Signed)
Lets have another BHCG drawn. No need for visit. I will call with instructions after.

## 2016-12-31 NOTE — Telephone Encounter (Signed)
Patient calling in to see if she needed to come in for labs. Please call patient and advise. OK to leave detailed message.

## 2016-12-31 NOTE — Telephone Encounter (Signed)
Future lab order placed.  Voicemail left letting patient know to come by and have lab drawn.

## 2017-01-04 DIAGNOSIS — N926 Irregular menstruation, unspecified: Secondary | ICD-10-CM | POA: Diagnosis not present

## 2017-01-04 DIAGNOSIS — Z3200 Encounter for pregnancy test, result unknown: Secondary | ICD-10-CM | POA: Diagnosis not present

## 2017-04-08 ENCOUNTER — Ambulatory Visit: Payer: BLUE CROSS/BLUE SHIELD | Admitting: Family Medicine

## 2017-04-08 VITALS — BP 110/72 | HR 72 | Temp 97.5°F | Wt 148.8 lb

## 2017-04-08 DIAGNOSIS — R509 Fever, unspecified: Secondary | ICD-10-CM

## 2017-04-08 DIAGNOSIS — J039 Acute tonsillitis, unspecified: Secondary | ICD-10-CM

## 2017-04-08 LAB — CBC
HCT: 43.2 % (ref 36.0–46.0)
Hemoglobin: 14.7 g/dL (ref 12.0–15.0)
MCHC: 34 g/dL (ref 30.0–36.0)
MCV: 92.7 fl (ref 78.0–100.0)
Platelets: 163 10*3/uL (ref 150.0–400.0)
RBC: 4.66 Mil/uL (ref 3.87–5.11)
RDW: 12.2 % (ref 11.5–15.5)
WBC: 5.2 10*3/uL (ref 4.0–10.5)

## 2017-04-08 LAB — POCT INFLUENZA A: Rapid Influenza A Ag: NEGATIVE

## 2017-04-08 LAB — POCT RAPID STREP A (OFFICE): Rapid Strep A Screen: NEGATIVE

## 2017-04-08 LAB — POCT MONO (EPSTEIN BARR VIRUS): Mono, POC: NEGATIVE

## 2017-04-08 MED ORDER — PREDNISONE 5 MG PO TABS: 5.0000 mg | ORAL_TABLET | Freq: Every day | ORAL | 0 refills | Status: DC

## 2017-04-08 NOTE — Progress Notes (Signed)
Megan Fernandez is a 31 y.o. female here for an acute visit.  History of Present Illness:   URI   This is a new problem. The current episode started 1 to 4 weeks ago. The problem has been gradually worsening. Maximum temperature: subjective. Associated symptoms include congestion, coughing, rhinorrhea, a sore throat and swollen glands. Pertinent negatives include no abdominal pain, chest pain, diarrhea, dysuria, ear pain, headaches, joint pain, joint swelling, nausea, neck pain, plugged ear sensation, rash, sinus pain, sneezing, vomiting or wheezing. She has tried sleep, NSAIDs and increased fluids for the symptoms. The treatment provided mild relief.   PMHx, SurgHx, SocialHx, Medications, and Allergies were reviewed in the Visit Navigator and updated as appropriate.  Current Medications:   .  buPROPion (WELLBUTRIN XL) 300 MG 24 hr tablet, Take 1 tablet (300 mg total) by mouth daily., Disp: 90 tablet, Rfl: 3 .  FLUoxetine (PROZAC) 20 MG capsule, Take 1 capsule (20 mg total) by mouth every morning., Disp: 90 capsule, Rfl: 3 .  MICROGESTIN FE 1.5/30 1.5-30 MG-MCG tablet, Take 1 tablet by mouth daily., Disp: 1 Package, Rfl: 4   Allergies  Allergen Reactions  . Penicillins Anaphylaxis and Swelling     Tongue swelled as a child  . Codeine Nausea And Vomiting  . Doxycycline Hives    Pt unsure of true allergy  . Morphine And Related Nausea Only   Review of Systems:   Pertinent items are noted in the HPI. Otherwise, ROS is negative.  Vitals:   Vitals:   04/08/17 0933  BP: 110/72  Pulse: 72  Temp: (!) 97.5 F (36.4 C)  TempSrc: Oral  SpO2: 98%  Weight: 148 lb 12.8 oz (67.5 kg)     Body mass index is 24.02 kg/m. Physical Exam:   Physical Exam  Constitutional: She appears well-nourished.  HENT:  Head: Normocephalic and atraumatic.  Mouth/Throat: Posterior oropharyngeal edema and posterior oropharyngeal erythema present.  Eyes: EOM are normal. Pupils are equal, round, and  reactive to light.  Neck: Normal range of motion. Neck supple.  Cardiovascular: Normal rate, regular rhythm, normal heart sounds and intact distal pulses.  Pulmonary/Chest: Effort normal.  Abdominal: Soft.  Skin: Skin is warm.  Psychiatric: She has a normal mood and affect. Her behavior is normal.  Nursing note and vitals reviewed.   Results for orders placed or performed in visit on 04/08/17  CBC  Result Value Ref Range   WBC 5.2 4.0 - 10.5 K/uL   RBC 4.66 3.87 - 5.11 Mil/uL   Platelets 163.0 150.0 - 400.0 K/uL   Hemoglobin 14.7 12.0 - 15.0 g/dL   HCT 45.443.2 09.836.0 - 11.946.0 %   MCV 92.7 78.0 - 100.0 fl   MCHC 34.0 30.0 - 36.0 g/dL   RDW 14.712.2 82.911.5 - 56.215.5 %  POCT Influenza A  Result Value Ref Range   Rapid Influenza A Ag neg   POCT rapid strep A  Result Value Ref Range   Rapid Strep A Screen Negative Negative  POCT Mono (Epstein Barr Virus)  Result Value Ref Range   Mono, POC Negative Negative    Assessment and Plan:   Diagnoses and all orders for this visit:  Tonsillitis -     POCT Influenza A -     POCT rapid strep A -     POCT Mono (Epstein Barr Virus) -     CBC -     predniSONE (DELTASONE) 5 MG tablet; Take 1 tablet (5 mg total) daily with breakfast  by mouth. 6-5-4-3-2-1-off  Fever, unspecified fever cause -     POCT Influenza A -     POCT rapid strep A   . Reviewed expectations re: course of current medical issues. . Discussed self-management of symptoms. . Outlined signs and symptoms indicating need for more acute intervention. . Patient verbalized understanding and all questions were answered. Marland Kitchen. Health Maintenance issues including appropriate healthy diet, exercise, and smoking avoidance were discussed with patient. . See orders for this visit as documented in the electronic medical record. . Patient received an After Visit Summary.  Helane RimaErica Chanie Soucek, DO [place], [facility] 04/10/2017

## 2017-04-10 ENCOUNTER — Encounter: Payer: Self-pay | Admitting: Family Medicine

## 2017-04-10 NOTE — Patient Instructions (Addendum)
Tonsillitis Tonsillitis is an infection of the throat. This infection causes the tonsils to become red, tender, and swollen. Tonsils are tissues in the back of your throat. If bacteria caused your infection, antibiotic medicine will be given to you. Sometimes, symptoms of this infection can be helped with the use of steroid medicine. If your tonsillitis is very bad (severe) and happens often, you may need to get your tonsils removed (tonsillectomy). Follow these instructions at home: Medicines  Take over-the-counter and prescription medicines only as told by your doctor.  If you were prescribed an antibiotic, take it as told by your doctor. Do not stop taking the antibiotic even if you start to feel better. Eating and drinking  Drink enough fluid to keep your pee (urine) clear or pale yellow.  While your throat is sore, eat soft or liquid foods like: ? Soup. ? Sherbert. ? Instant breakfast drinks.  Drink warm fluids.  Eat frozen ice pops. General instructions  Rest as much as possible and get plenty of sleep.  Gargle with a salt-water mixture 3-4 times a day or as needed. To make a salt-water mixture, completely dissolve -1 tsp of salt in 1 cup of warm water.  Wash your hands often with soap and water. If there is no soap and water, use hand sanitizer.  Do not share cups, bottles, or other utensils until your symptoms are gone.  Do not smoke. If you need help quitting, ask your doctor.  Keep all follow-up visits as told by your doctor. This is important. Contact a doctor if:  You have large, tender lumps in your neck.  You have a fever that does not go away after 2-3 days.  You have a rash.  You cough up green, yellow-brown, or bloody fluid.  You cannot swallow liquids or food for 24 hours.  Only one of your tonsils is swollen. Get help right away if:  You have any new symptoms such as: ? Vomiting ? Very bad headache ? Stiff neck ? Chest pain ? Trouble breathing  or swallowing  You have very bad throat pain and also have drooling or voice changes.  You have very bad pain that is not helped by medicine.  You cannot fully open your mouth.  You have redness, swelling, or severe pain anywhere in your neck. Summary  Tonsillitis causes your tonsils to be red, tender, and swollen.  While your throat is sore eat soft or liquid foods.  Gargle with a salt-water mixture 3-4 times a day or as needed.  Do not share cups, bottles, or other utensils until your symptoms are gone. This information is not intended to replace advice given to you by your health care provider. Make sure you discuss any questions you have with your health care provider. Document Released: 10/24/2007 Document Revised: 10/13/2015 Document Reviewed: 10/24/2012 Elsevier Interactive Patient Education  2017 Elsevier Inc.  

## 2017-04-17 DIAGNOSIS — H5213 Myopia, bilateral: Secondary | ICD-10-CM | POA: Diagnosis not present

## 2017-04-22 ENCOUNTER — Telehealth: Payer: Self-pay

## 2017-04-22 NOTE — Telephone Encounter (Signed)
Copied from CRM [id number]. Topic: General - Other >> Apr 22, 2017  2:54 PM Eston Mouldavis, Cheri B wrote: Reason for CRM: Pt called to let Helane Rimarica Wallace know her glands are gone down and her throat is better  however she is still fatigued

## 2017-04-22 NOTE — Telephone Encounter (Signed)
Please advise 

## 2017-05-02 ENCOUNTER — Ambulatory Visit: Payer: BLUE CROSS/BLUE SHIELD | Admitting: Family Medicine

## 2017-05-02 ENCOUNTER — Encounter: Payer: Self-pay | Admitting: Family Medicine

## 2017-05-02 ENCOUNTER — Other Ambulatory Visit: Payer: Self-pay

## 2017-05-02 ENCOUNTER — Telehealth: Payer: Self-pay

## 2017-05-02 VITALS — BP 120/72 | HR 70 | Temp 98.4°F | Wt 147.4 lb

## 2017-05-02 DIAGNOSIS — E039 Hypothyroidism, unspecified: Secondary | ICD-10-CM

## 2017-05-02 DIAGNOSIS — R5383 Other fatigue: Secondary | ICD-10-CM

## 2017-05-02 DIAGNOSIS — E538 Deficiency of other specified B group vitamins: Secondary | ICD-10-CM

## 2017-05-02 DIAGNOSIS — F900 Attention-deficit hyperactivity disorder, predominantly inattentive type: Secondary | ICD-10-CM | POA: Diagnosis not present

## 2017-05-02 DIAGNOSIS — E038 Other specified hypothyroidism: Secondary | ICD-10-CM

## 2017-05-02 DIAGNOSIS — F339 Major depressive disorder, recurrent, unspecified: Secondary | ICD-10-CM | POA: Diagnosis not present

## 2017-05-02 LAB — COMPREHENSIVE METABOLIC PANEL
ALT: 22 U/L (ref 0–35)
AST: 16 U/L (ref 0–37)
Albumin: 4.1 g/dL (ref 3.5–5.2)
Alkaline Phosphatase: 42 U/L (ref 39–117)
BUN: 12 mg/dL (ref 6–23)
CO2: 27 mEq/L (ref 19–32)
Calcium: 8.9 mg/dL (ref 8.4–10.5)
Chloride: 108 mEq/L (ref 96–112)
Creatinine, Ser: 0.77 mg/dL (ref 0.40–1.20)
GFR: 91.43 mL/min (ref >60.00)
Glucose, Bld: 93 mg/dL (ref 70–99)
Potassium: 4.5 mEq/L (ref 3.5–5.1)
Sodium: 139 mEq/L (ref 135–145)
Total Bilirubin: 0.7 mg/dL (ref 0.2–1.2)
Total Protein: 6.7 g/dL (ref 6.0–8.3)

## 2017-05-02 LAB — CBC WITH DIFFERENTIAL/PLATELET
Basophils Absolute: 0 10*3/uL (ref 0.0–0.1)
Basophils Relative: 0.4 % (ref 0.0–3.0)
Eosinophils Absolute: 0 10*3/uL (ref 0.0–0.7)
Eosinophils Relative: 0.9 % (ref 0.0–5.0)
HCT: 43.6 % (ref 36.0–46.0)
Hemoglobin: 14.5 g/dL (ref 12.0–15.0)
Lymphocytes Relative: 49.8 % — ABNORMAL HIGH (ref 12.0–46.0)
Lymphs Abs: 2.5 10*3/uL (ref 0.7–4.0)
MCHC: 33.2 g/dL (ref 30.0–36.0)
MCV: 91.7 fl (ref 78.0–100.0)
Monocytes Absolute: 0.3 10*3/uL (ref 0.1–1.0)
Monocytes Relative: 7.1 % (ref 3.0–12.0)
Neutro Abs: 2.1 10*3/uL (ref 1.4–7.7)
Neutrophils Relative %: 41.8 % — ABNORMAL LOW (ref 43.0–77.0)
Platelets: 186 10*3/uL (ref 150.0–400.0)
RBC: 4.76 Mil/uL (ref 3.87–5.11)
RDW: 12.5 % (ref 11.5–15.5)
WBC: 4.9 10*3/uL (ref 4.0–10.5)

## 2017-05-02 LAB — TSH: TSH: 2.91 u[IU]/mL (ref 0.35–4.50)

## 2017-05-02 LAB — VITAMIN B12: Vitamin B-12: 330 pg/mL (ref 211–911)

## 2017-05-02 LAB — HCG, QUANTITATIVE, PREGNANCY: Quantitative HCG: 0.01 m[IU]/mL

## 2017-05-02 LAB — T4, FREE: Free T4: 0.81 ng/dL (ref 0.60–1.60)

## 2017-05-02 MED ORDER — LISDEXAMFETAMINE DIMESYLATE 20 MG PO CAPS: 20.0000 mg | ORAL_CAPSULE | Freq: Every day | ORAL | 0 refills | Status: AC

## 2017-05-02 MED ORDER — BUPROPION HCL ER (SR) 100 MG PO TB12: 100.0000 mg | ORAL_TABLET | Freq: Two times a day (BID) | ORAL | 0 refills | Status: AC

## 2017-05-02 NOTE — Patient Instructions (Signed)
Wellbutrin taper 300 daily x 1 week, then 150 daily x 1 week, then  100 mg daily x 1 week, then 50 mg daily x 1 week.  Focus on the Wellbutrin first and check in with me (1 MONTH).

## 2017-05-02 NOTE — Progress Notes (Addendum)
Megan Fernandez is a 31 y.o. female is here for follow up.  History of Present Illness:   HPI: See Assessment and Plan section for Problem Based Charting of issues discussed today.  There are no preventive care reminders to display for this patient. [facility] 05/02/2017 07/16/2016  Decreased Interest 0 3  Down, Depressed, Hopeless 0 3  PHQ - 2 Score 0 6  Altered sleeping 0 3  Tired, decreased energy 3 3  Change in appetite 0 1  Feeling bad or failure about yourself  0 3  Trouble concentrating 3 3  Moving slowly or fidgety/restless 0 0  Suicidal thoughts 0 1  PHQ-9 Score 6 20   PMHx, SurgHx, SocialHx, FamHx, Medications, and Allergies were reviewed in the Visit Navigator and updated as appropriate.   Patient Active Problem List   Diagnosis Date Noted  . Encounter for surveillance of contraceptive pills 09/21/2016  . Depression, recurrent (HCC) 07/23/2016  . B12 deficiency 07/23/2016  . Vitamin D deficiency 07/23/2016  . SVT (supraventricular tachycardia) (HCC) 07/16/2016  . Subclinical hypothyroidism 04/22/2007   Social History   Tobacco Use  . Smoking status: Never Smoker  . Smokeless tobacco: Never Used  Substance Use Topics  . Alcohol use: Yes    Comment: socially  . Drug use: No   Current Medications and Allergies:   .  buPROPion (WELLBUTRIN XL) 300 MG 24 hr tablet, Take 1 tablet (300 mg total) by mouth daily., Disp: 90 tablet, Rfl: 3 .  FLUoxetine (PROZAC) 20 MG capsule, Take 1 capsule (20 mg total) by mouth every morning., Disp: 90 capsule, Rfl: 3 .  MICROGESTIN FE 1.5/30 1.5-30 MG-MCG tablet, Take 1 tablet by mouth daily., Disp: 1 Package, Rfl: 4  Allergies  Allergen Reactions  . Penicillins Anaphylaxis and Swelling  . Codeine Nausea And Vomiting  . Doxycycline Hives  . Morphine And Related Nausea Only   Review of Systems   Pertinent items are noted in the HPI. Otherwise, ROS is negative.  Vitals:   Vitals:   05/02/17 0734  BP:  120/72  Pulse: 70  Temp: 98.4 F (36.9 C)  TempSrc: Oral  SpO2: 97%  Weight: 147 lb 6.4 oz (66.9 kg)     Body mass index is 23.79 kg/m.   Physical Exam:   General: Cooperative, alert and oriented, well developed, well nourished, in no acute distress. HEENT: Pupils equal round reactive light and extraocular movements intact. Conjunctivae and lids unremarkable, funduscopic exam and visual fields not performed. No pallor or cyanosis, dentition good. Neck: No thyromegaly. No JVD. No carotid bruits.  Cardiovascular: Regular rhythm. S1 normal. S2 normal. No S3 or S4. Apical impulse not displaced. No murmurs. No gallops. No rubs. Lungs: Clear bilaterally without rales, rhonchi, or wheezing. Normal symmetry, no tenderness to palpation, normal respiratory excursion, no use of accessory muscles. Abdomen: Soft, nontender, no masses or hepatosplenomegaly. Extremities: No clubbing, cyanosis, erythema. No edema. Normal muscle strength and tone. Pulses: 2+ radial, 2+ femoral pulses, 2+ pedal pulses. Skin: Reveals no rashes.  Neurologic: Cranial nerves are intact with no focal deficits.  Psychiatric: Alert and oriented to person place and time.  Results for orders placed or performed in visit on 05/02/17  CBC with Differential/Platelet  Result Value Ref Range   WBC 4.9 4.0 - 10.5 K/uL   RBC 4.76 3.87 - 5.11 Mil/uL   Hemoglobin 14.5 12.0 - 15.0 g/dL   HCT 30.843.6 65.736.0 - 84.646.0 %   MCV 91.7 78.0 - 100.0  fl   MCHC 33.2 30.0 - 36.0 g/dL   RDW 16.1 09.6 - 04.5 %   Platelets 186.0 150.0 - 400.0 K/uL   Neutrophils Relative % 41.8 (L) 43.0 - 77.0 %   Lymphocytes Relative 49.8 (H) 12.0 - 46.0 %   Monocytes Relative 7.1 3.0 - 12.0 %   Eosinophils Relative 0.9 0.0 - 5.0 %   Basophils Relative 0.4 0.0 - 3.0 %   Neutro Abs 2.1 1.4 - 7.7 K/uL   Lymphs Abs 2.5 0.7 - 4.0 K/uL   Monocytes Absolute 0.3 0.1 - 1.0 K/uL   Eosinophils Absolute 0.0 0.0 - 0.7 K/uL   Basophils Absolute 0.0 0.0 - 0.1 K/uL    Comprehensive metabolic panel  Result Value Ref Range   Sodium 139 135 - 145 mEq/L   Potassium 4.5 3.5 - 5.1 mEq/L   Chloride 108 96 - 112 mEq/L   CO2 27 19 - 32 mEq/L   Glucose, Bld 93 70 - 99 mg/dL   BUN 12 6 - 23 mg/dL   Creatinine, Ser 4.09 0.40 - 1.20 mg/dL   Total Bilirubin 0.7 0.2 - 1.2 mg/dL   Alkaline Phosphatase 42 39 - 117 U/L   AST 16 0 - 37 U/L   ALT 22 0 - 35 U/L   Total Protein 6.7 6.0 - 8.3 g/dL   Albumin 4.1 3.5 - 5.2 g/dL   Calcium 8.9 8.4 - 81.1 mg/dL   GFR 91.47 >82.95 mL/min  TSH  Result Value Ref Range   TSH 2.91 0.35 - 4.50 uIU/mL  T4, free  Result Value Ref Range   Free T4 0.81 0.60 - 1.60 ng/dL  Vitamin A21  Result Value Ref Range   Vitamin B-12 330 211 - 911 pg/mL  B-HCG Quant  Result Value Ref Range   Quantitative HCG 0.01 mIU/ml   Assessment and Plan:   [name] was seen today for fatigue.  Diagnoses and all orders for this visit:  Fatigue, unspecified type Comments: This is an ongoing and worsening issue for the patient.  Labs were normal today.  She continues to have a low normal B12 level.  I have offered to give her a prescription for monthly injections to do at home.  She would like to try this.  She is currently taking a B12 supplement orally.  Exercise, healthy food choices, and stress reduction were encouraged today as well. Orders: -     CBC with Differential/Platelet -     Comprehensive metabolic panel -     B-HCG Quant -     T3, reverse  Subclinical hypothyroidism Comments: Monitoring today.  Labs were reassuring.  Thyroid peroxidase antibody is still pending. Orders: -     TSH -     T4, free -     Thyroid peroxidase antibody  B12 deficiency Comments: As above, offered injections to continue at home.  Will order. Orders: -     Vitamin B12  Depression, recurrent (HCC) Comments: Improving.  Much of her depression seems to be related to stress and to be situational.  She is in a much better place in her marriage.  Work is  still very stressful.  She has a 65-year-old daughter.  She and her husband are trying to decide if they would like to have another child.  We will go ahead and start weaning Wellbutrin now to see if she tolerates this.  See AVS for instructions.  100 mg tablets provided for weaning purposes. Orders: -  buPROPion (WELLBUTRIN SR) 100 MG 12 hr tablet; Take 1 tablet (100 mg total) by mouth 2 (two) times daily.  Attention deficit hyperactivity disorder (ADHD), predominantly inattentive type Comments: Diagnosis as a child.  Of note, the patient is a Warden/rangerpsychologist.  She is interested in trying medication based on worsening situational anxiety associated with trouble focusing.  Vyvanse provided today.  Will probably need prior authorization. Orders: -     lisdexamfetamine (VYVANSE) 20 MG capsule; Take 1 capsule (20 mg total) by mouth daily.   . Reviewed expectations re: course of current medical issues. . Discussed self-management of symptoms. . Outlined signs and symptoms indicating need for more acute intervention. . Patient verbalized understanding and all questions were answered. Marland Kitchen. Health Maintenance issues including appropriate healthy diet, exercise, and smoking avoidance were discussed with patient. . See orders for this visit as documented in the electronic medical record. . Patient received an After Visit Summary.  Helane RimaErica Adante Courington, DO [place], [facility] [place] 05/03/2017  No future appointments.

## 2017-05-02 NOTE — Telephone Encounter (Signed)
PA for Vyvanse initiated.  Awaiting insurance decision.

## 2017-05-03 ENCOUNTER — Encounter: Payer: Self-pay | Admitting: Family Medicine

## 2017-05-03 LAB — THYROID PEROXIDASE ANTIBODY: Thyroperoxidase Ab SerPl-aCnc: <1 IU/mL (ref <9)

## 2017-05-03 MED ORDER — CYANOCOBALAMIN 1000 MCG/ML IJ SOLN: INTRAMUSCULAR | 15 refills | Status: AC

## 2017-05-05 LAB — T3, REVERSE: T3, Reverse: 12 ng/dL (ref 8–25)

## 2017-05-15 NOTE — Telephone Encounter (Signed)
Denial received for not having one or both of the following  Your symptoms have been present prior to 12 yrs of age  Your symptoms interfere with or reduce the quality of academic or occupational functioning

## 2017-05-15 NOTE — Telephone Encounter (Signed)
This patient did have a diagnosis prior to the age of 31.  She was not treated until now.  She also reports impaired occupational functioning.  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  She has noticed worsening focus.  This medication has been very helpful for her.

## 2017-05-16 NOTE — Telephone Encounter (Signed)
I have re submitted auth with [id number] and ID [id number]. Pending auth via [facility]

## 2017-09-28 ENCOUNTER — Other Ambulatory Visit: Payer: Self-pay | Admitting: Family Medicine

## 2017-09-28 DIAGNOSIS — F339 Major depressive disorder, recurrent, unspecified: Secondary | ICD-10-CM

## 2017-10-07 ENCOUNTER — Encounter: Payer: Self-pay | Admitting: Obstetrics & Gynecology

## 2017-10-07 DIAGNOSIS — Z0289 Encounter for other administrative examinations: Secondary | ICD-10-CM

## 2017-11-05 DIAGNOSIS — Z1151 Encounter for screening for human papillomavirus (HPV): Secondary | ICD-10-CM | POA: Diagnosis not present

## 2017-11-05 DIAGNOSIS — R635 Abnormal weight gain: Secondary | ICD-10-CM | POA: Diagnosis not present

## 2017-11-05 DIAGNOSIS — Z6824 Body mass index (BMI) 24.0-24.9, adult: Secondary | ICD-10-CM | POA: Diagnosis not present

## 2017-11-05 DIAGNOSIS — Z01419 Encounter for gynecological examination (general) (routine) without abnormal findings: Secondary | ICD-10-CM | POA: Diagnosis not present

## 2017-11-08 DIAGNOSIS — M5416 Radiculopathy, lumbar region: Secondary | ICD-10-CM | POA: Diagnosis not present

## 2017-11-08 DIAGNOSIS — M9903 Segmental and somatic dysfunction of lumbar region: Secondary | ICD-10-CM | POA: Diagnosis not present

## 2017-11-08 DIAGNOSIS — M9902 Segmental and somatic dysfunction of thoracic region: Secondary | ICD-10-CM | POA: Diagnosis not present

## 2017-11-08 DIAGNOSIS — M5415 Radiculopathy, thoracolumbar region: Secondary | ICD-10-CM | POA: Diagnosis not present

## 2017-11-13 DIAGNOSIS — M9903 Segmental and somatic dysfunction of lumbar region: Secondary | ICD-10-CM | POA: Diagnosis not present

## 2017-11-13 DIAGNOSIS — M5415 Radiculopathy, thoracolumbar region: Secondary | ICD-10-CM | POA: Diagnosis not present

## 2017-11-13 DIAGNOSIS — M9902 Segmental and somatic dysfunction of thoracic region: Secondary | ICD-10-CM | POA: Diagnosis not present

## 2017-11-13 DIAGNOSIS — M5416 Radiculopathy, lumbar region: Secondary | ICD-10-CM | POA: Diagnosis not present

## 2017-11-14 DIAGNOSIS — M5416 Radiculopathy, lumbar region: Secondary | ICD-10-CM | POA: Diagnosis not present

## 2017-11-14 DIAGNOSIS — M5415 Radiculopathy, thoracolumbar region: Secondary | ICD-10-CM | POA: Diagnosis not present

## 2017-11-14 DIAGNOSIS — M9903 Segmental and somatic dysfunction of lumbar region: Secondary | ICD-10-CM | POA: Diagnosis not present

## 2017-11-14 DIAGNOSIS — M9902 Segmental and somatic dysfunction of thoracic region: Secondary | ICD-10-CM | POA: Diagnosis not present

## 2017-11-19 DIAGNOSIS — Z0000 Encounter for general adult medical examination without abnormal findings: Secondary | ICD-10-CM | POA: Diagnosis not present

## 2017-11-19 DIAGNOSIS — Z131 Encounter for screening for diabetes mellitus: Secondary | ICD-10-CM | POA: Diagnosis not present

## 2017-11-19 DIAGNOSIS — Z1329 Encounter for screening for other suspected endocrine disorder: Secondary | ICD-10-CM | POA: Diagnosis not present

## 2017-11-19 DIAGNOSIS — Z130 Encounter for screening for diseases of the blood and blood-forming organs and certain disorders involving the immune mechanism: Secondary | ICD-10-CM | POA: Diagnosis not present

## 2017-11-19 DIAGNOSIS — Z13220 Encounter for screening for lipoid disorders: Secondary | ICD-10-CM | POA: Diagnosis not present

## 2017-12-04 ENCOUNTER — Other Ambulatory Visit: Payer: Self-pay | Admitting: Family Medicine

## 2017-12-04 DIAGNOSIS — F339 Major depressive disorder, recurrent, unspecified: Secondary | ICD-10-CM

## 2018-01-20 DIAGNOSIS — J069 Acute upper respiratory infection, unspecified: Secondary | ICD-10-CM | POA: Diagnosis not present

## 2018-03-27 DIAGNOSIS — L739 Follicular disorder, unspecified: Secondary | ICD-10-CM | POA: Diagnosis not present

## 2018-03-27 DIAGNOSIS — L723 Sebaceous cyst: Secondary | ICD-10-CM | POA: Diagnosis not present

## 2018-03-27 DIAGNOSIS — L110 Acquired keratosis follicularis: Secondary | ICD-10-CM | POA: Diagnosis not present

## 2018-03-27 DIAGNOSIS — L700 Acne vulgaris: Secondary | ICD-10-CM | POA: Diagnosis not present

## 2018-04-15 DIAGNOSIS — I781 Nevus, non-neoplastic: Secondary | ICD-10-CM | POA: Diagnosis not present

## 2018-04-15 DIAGNOSIS — L700 Acne vulgaris: Secondary | ICD-10-CM | POA: Diagnosis not present

## 2018-04-15 DIAGNOSIS — L821 Other seborrheic keratosis: Secondary | ICD-10-CM | POA: Diagnosis not present

## 2018-04-15 DIAGNOSIS — D225 Melanocytic nevi of trunk: Secondary | ICD-10-CM | POA: Diagnosis not present

## 2018-05-08 DIAGNOSIS — J069 Acute upper respiratory infection, unspecified: Secondary | ICD-10-CM | POA: Diagnosis not present

## 2018-10-29 DIAGNOSIS — M7611 Psoas tendinitis, right hip: Secondary | ICD-10-CM | POA: Diagnosis not present

## 2018-10-29 DIAGNOSIS — M9903 Segmental and somatic dysfunction of lumbar region: Secondary | ICD-10-CM | POA: Diagnosis not present

## 2018-10-29 DIAGNOSIS — M5386 Other specified dorsopathies, lumbar region: Secondary | ICD-10-CM | POA: Diagnosis not present

## 2018-10-29 DIAGNOSIS — M25571 Pain in right ankle and joints of right foot: Secondary | ICD-10-CM | POA: Diagnosis not present

## 2018-10-31 DIAGNOSIS — Z20828 Contact with and (suspected) exposure to other viral communicable diseases: Secondary | ICD-10-CM | POA: Diagnosis not present

## 2018-11-18 IMAGING — DX DG CHEST 2V
2 series · 2 of 2 positions shown · non-contrast
Comparison: None.

CLINICAL DATA: 30-year-old female with chest pain and tachycardia.
Headache and dizziness. Initial encounter.

EXAM:
CHEST  2 VIEW

[chest pa]
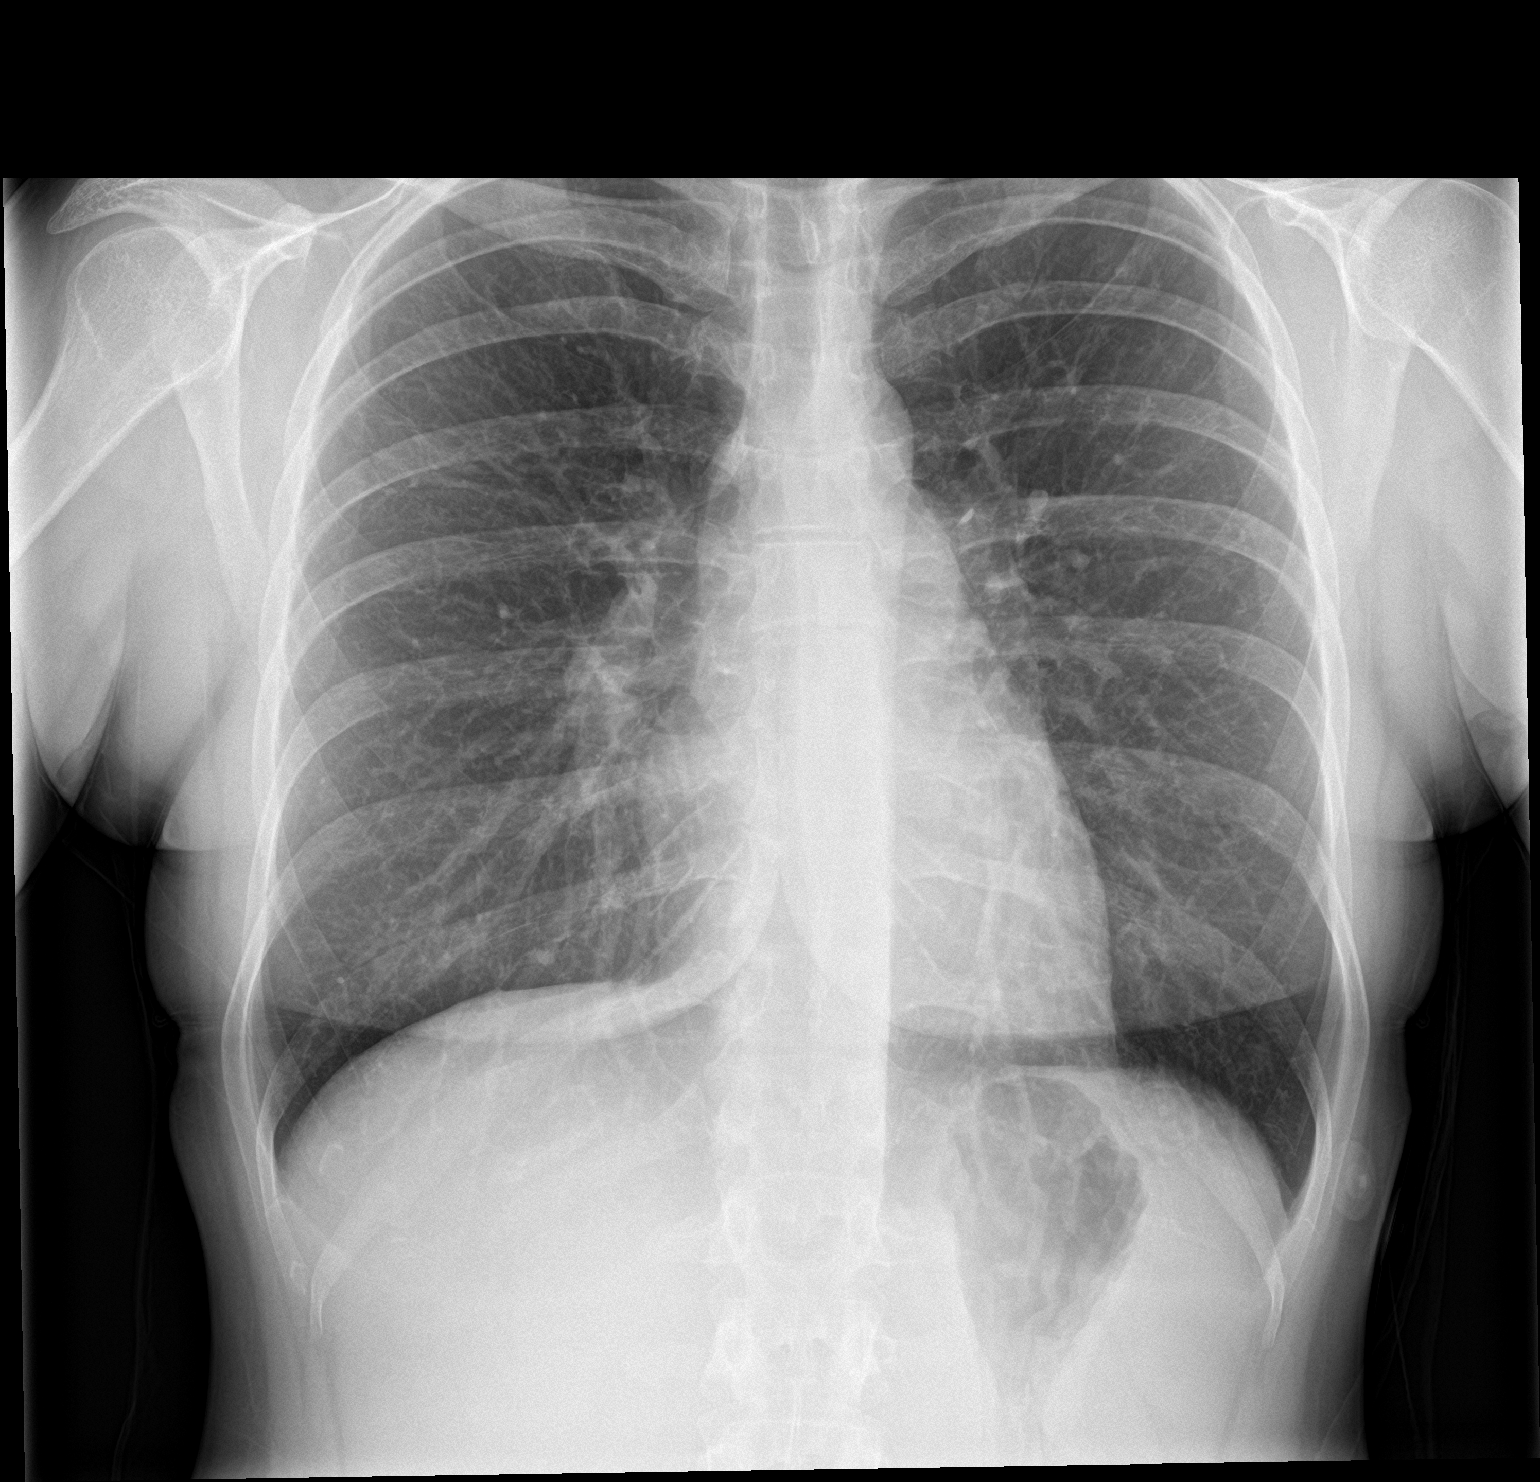

[chest lat]
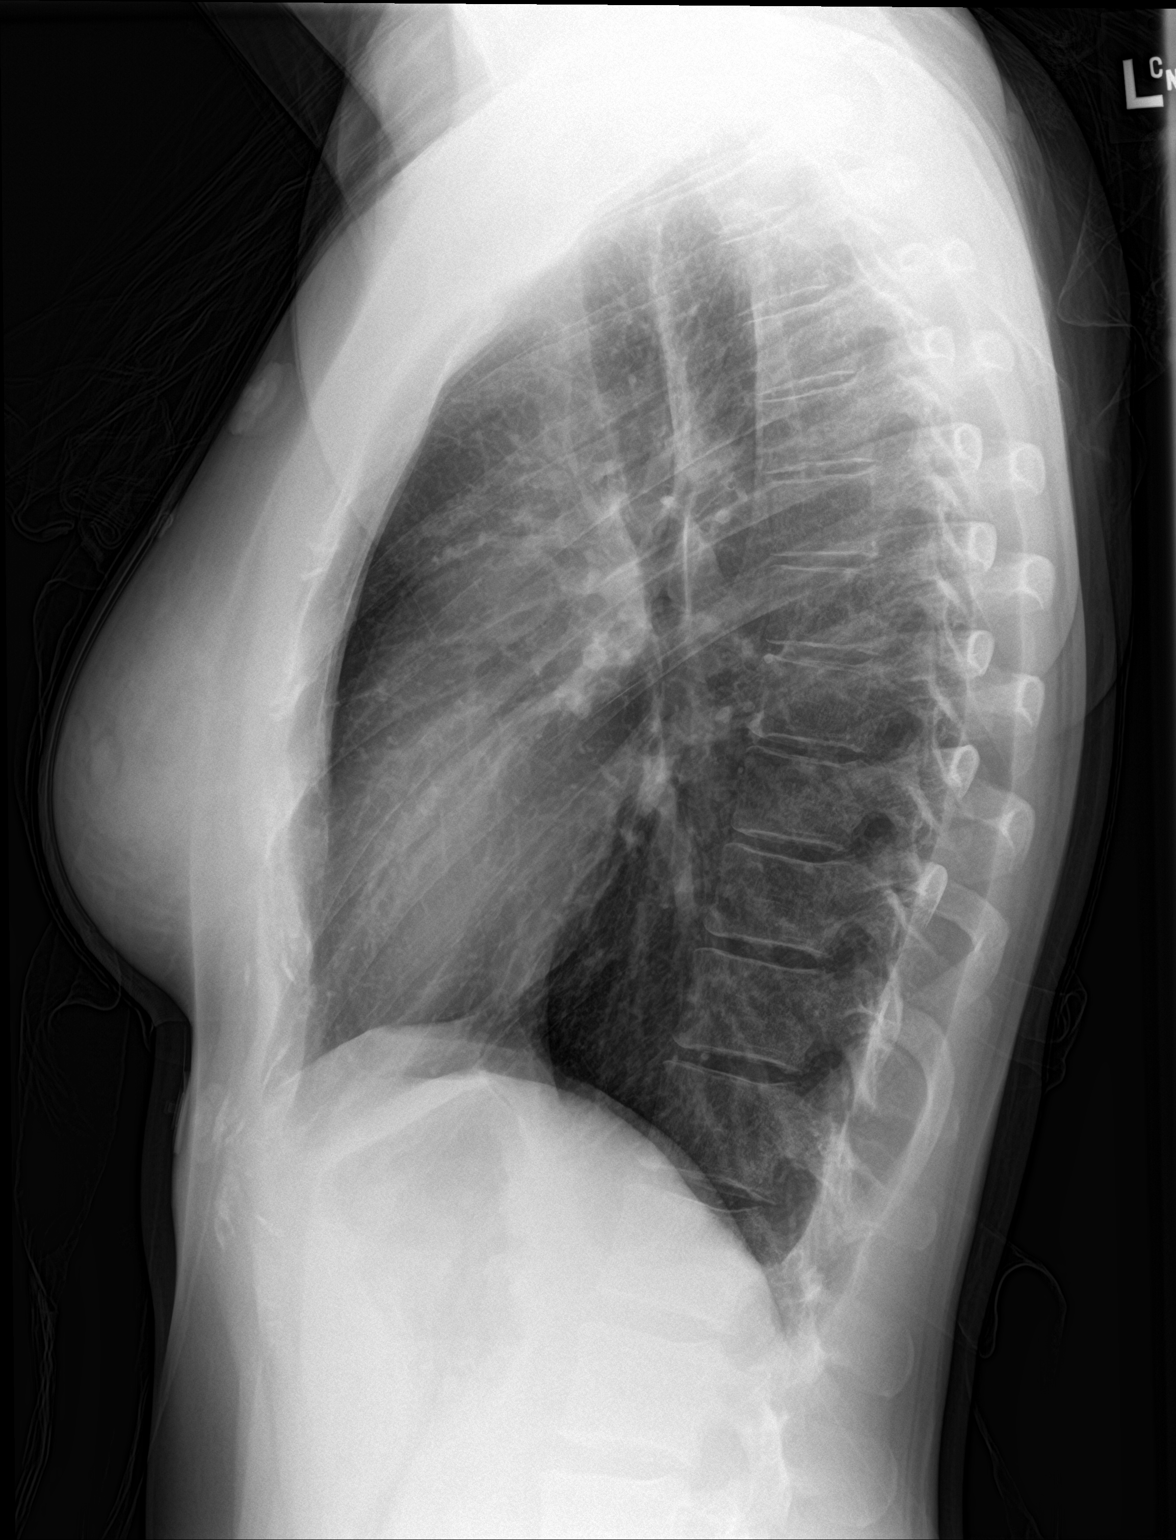

[2 of 2 positions shown; findings below may reference images not displayed]

FINDINGS: Normal lung volumes. Normal cardiac size and mediastinal contours.
Visualized tracheal air column is within normal limits. The lungs
are clear. No pneumothorax or pleural effusion. No osseous
abnormality identified.
IMPRESSION: Negative. No acute cardiopulmonary abnormality.

## 2018-12-22 DIAGNOSIS — R0981 Nasal congestion: Secondary | ICD-10-CM | POA: Diagnosis not present

## 2018-12-22 DIAGNOSIS — Z20828 Contact with and (suspected) exposure to other viral communicable diseases: Secondary | ICD-10-CM | POA: Diagnosis not present

## 2018-12-22 DIAGNOSIS — R0982 Postnasal drip: Secondary | ICD-10-CM | POA: Diagnosis not present

## 2018-12-22 DIAGNOSIS — R05 Cough: Secondary | ICD-10-CM | POA: Diagnosis not present

## 2019-04-09 DIAGNOSIS — J069 Acute upper respiratory infection, unspecified: Secondary | ICD-10-CM | POA: Diagnosis not present

## 2019-04-09 DIAGNOSIS — J101 Influenza due to other identified influenza virus with other respiratory manifestations: Secondary | ICD-10-CM | POA: Diagnosis not present

## 2019-04-09 DIAGNOSIS — Z20828 Contact with and (suspected) exposure to other viral communicable diseases: Secondary | ICD-10-CM | POA: Diagnosis not present

## 2019-05-01 DIAGNOSIS — M791 Myalgia, unspecified site: Secondary | ICD-10-CM | POA: Diagnosis not present

## 2019-05-01 DIAGNOSIS — R42 Dizziness and giddiness: Secondary | ICD-10-CM | POA: Diagnosis not present

## 2019-05-01 DIAGNOSIS — Z20828 Contact with and (suspected) exposure to other viral communicable diseases: Secondary | ICD-10-CM | POA: Diagnosis not present

## 2019-05-01 DIAGNOSIS — R5383 Other fatigue: Secondary | ICD-10-CM | POA: Diagnosis not present

## 2019-05-01 DIAGNOSIS — R0982 Postnasal drip: Secondary | ICD-10-CM | POA: Diagnosis not present

## 2019-05-01 DIAGNOSIS — R112 Nausea with vomiting, unspecified: Secondary | ICD-10-CM | POA: Diagnosis not present

## 2019-05-01 DIAGNOSIS — J3489 Other specified disorders of nose and nasal sinuses: Secondary | ICD-10-CM | POA: Diagnosis not present

## 2019-05-01 DIAGNOSIS — R519 Headache, unspecified: Secondary | ICD-10-CM | POA: Diagnosis not present
